# Patient Record
Sex: Female | Born: 1965 | Race: White | Hispanic: No | Marital: Married | Smoking: Never smoker
Health system: Southern US, Community
[De-identification: ages and names within clinical notes are randomized; demographics above are authoritative.]

## PROBLEM LIST (undated history)

## (undated) DIAGNOSIS — F32A Depression, unspecified: Secondary | ICD-10-CM

## (undated) DIAGNOSIS — F329 Major depressive disorder, single episode, unspecified: Secondary | ICD-10-CM

## (undated) DIAGNOSIS — E785 Hyperlipidemia, unspecified: Secondary | ICD-10-CM

## (undated) DIAGNOSIS — E049 Nontoxic goiter, unspecified: Secondary | ICD-10-CM

## (undated) HISTORY — DX: Depression, unspecified: F32.A

## (undated) HISTORY — PX: DILATION AND CURETTAGE OF UTERUS: SHX78

## (undated) HISTORY — DX: Nontoxic goiter, unspecified: E04.9

## (undated) HISTORY — DX: Hyperlipidemia, unspecified: E78.5

## (undated) HISTORY — DX: Major depressive disorder, single episode, unspecified: F32.9

---

## 1998-08-20 ENCOUNTER — Ambulatory Visit (HOSPITAL_COMMUNITY): Admission: RE | Admit: 1998-08-20 | Discharge: 1998-08-20 | Payer: Self-pay | Admitting: Obstetrics and Gynecology

## 1999-03-30 ENCOUNTER — Other Ambulatory Visit: Admission: RE | Admit: 1999-03-30 | Discharge: 1999-03-30 | Payer: Self-pay | Admitting: Obstetrics and Gynecology

## 1999-11-08 ENCOUNTER — Ambulatory Visit (HOSPITAL_COMMUNITY): Admission: RE | Admit: 1999-11-08 | Discharge: 1999-11-08 | Payer: Self-pay | Admitting: Obstetrics and Gynecology

## 1999-11-08 ENCOUNTER — Encounter: Payer: Self-pay | Admitting: Obstetrics and Gynecology

## 1999-12-06 ENCOUNTER — Inpatient Hospital Stay (HOSPITAL_COMMUNITY): Admission: AD | Admit: 1999-12-06 | Discharge: 1999-12-06 | Payer: Self-pay | Admitting: *Deleted

## 2000-01-16 ENCOUNTER — Inpatient Hospital Stay (HOSPITAL_COMMUNITY): Admission: AD | Admit: 2000-01-16 | Discharge: 2000-01-18 | Payer: Self-pay | Admitting: *Deleted

## 2000-01-17 ENCOUNTER — Encounter: Payer: Self-pay | Admitting: *Deleted

## 2000-01-18 ENCOUNTER — Encounter: Payer: Self-pay | Admitting: Obstetrics & Gynecology

## 2000-01-19 ENCOUNTER — Inpatient Hospital Stay (HOSPITAL_COMMUNITY): Admission: AD | Admit: 2000-01-19 | Discharge: 2000-01-22 | Payer: Self-pay | Admitting: Obstetrics and Gynecology

## 2000-02-07 ENCOUNTER — Encounter: Admission: RE | Admit: 2000-02-07 | Discharge: 2000-05-07 | Payer: Self-pay | Admitting: Obstetrics and Gynecology

## 2000-03-02 ENCOUNTER — Other Ambulatory Visit: Admission: RE | Admit: 2000-03-02 | Discharge: 2000-03-02 | Payer: Self-pay | Admitting: Obstetrics and Gynecology

## 2000-07-06 ENCOUNTER — Encounter: Payer: Self-pay | Admitting: Obstetrics and Gynecology

## 2000-07-06 ENCOUNTER — Encounter: Admission: RE | Admit: 2000-07-06 | Discharge: 2000-07-06 | Payer: Self-pay | Admitting: Obstetrics and Gynecology

## 2003-02-02 ENCOUNTER — Encounter (HOSPITAL_COMMUNITY): Admission: RE | Admit: 2003-02-02 | Discharge: 2003-05-03 | Payer: Self-pay | Admitting: Endocrinology

## 2005-03-02 ENCOUNTER — Other Ambulatory Visit: Admission: RE | Admit: 2005-03-02 | Discharge: 2005-03-02 | Payer: Self-pay | Admitting: Obstetrics and Gynecology

## 2005-04-13 ENCOUNTER — Encounter: Payer: Self-pay | Admitting: Family Medicine

## 2005-04-13 LAB — CONVERTED CEMR LAB

## 2005-05-05 ENCOUNTER — Ambulatory Visit: Payer: Self-pay | Admitting: Family Medicine

## 2006-05-14 ENCOUNTER — Ambulatory Visit: Payer: Self-pay | Admitting: Family Medicine

## 2006-05-14 LAB — CONVERTED CEMR LAB
Albumin: 3.6 g/dL (ref 3.5–5.2)
Alkaline Phosphatase: 48 units/L (ref 39–117)
BUN: 9 mg/dL (ref 6–23)
Basophils Absolute: 0 10*3/uL (ref 0.0–0.1)
CO2: 31 meq/L (ref 19–32)
Cholesterol: 231 mg/dL (ref 0–200)
Eosinophils Absolute: 0.2 10*3/uL (ref 0.0–0.6)
GFR calc Af Amer: 142 mL/min
GFR calc non Af Amer: 118 mL/min
HDL: 68.2 mg/dL (ref >39.0)
Hemoglobin: 12.5 g/dL (ref 12.0–15.0)
Lymphocytes Relative: 30.5 % (ref 12.0–46.0)
MCHC: 34.4 g/dL (ref 30.0–36.0)
MCV: 91.7 fL (ref 78.0–100.0)
Monocytes Absolute: 0.4 10*3/uL (ref 0.2–0.7)
Monocytes Relative: 8.6 % (ref 3.0–11.0)
Neutro Abs: 2.8 10*3/uL (ref 1.4–7.7)
Platelets: 249 10*3/uL (ref 150–400)
Potassium: 3.9 meq/L (ref 3.5–5.1)
TSH: 1.58 microintl units/mL (ref 0.35–5.50)
Total Protein: 6.2 g/dL (ref 6.0–8.3)

## 2007-06-06 ENCOUNTER — Encounter: Payer: Self-pay | Admitting: Family Medicine

## 2007-06-06 DIAGNOSIS — F329 Major depressive disorder, single episode, unspecified: Secondary | ICD-10-CM

## 2007-06-06 DIAGNOSIS — E049 Nontoxic goiter, unspecified: Secondary | ICD-10-CM | POA: Insufficient documentation

## 2007-06-07 ENCOUNTER — Ambulatory Visit: Payer: Self-pay | Admitting: Family Medicine

## 2007-06-07 DIAGNOSIS — E785 Hyperlipidemia, unspecified: Secondary | ICD-10-CM

## 2007-06-14 ENCOUNTER — Ambulatory Visit: Payer: Self-pay | Admitting: Family Medicine

## 2007-06-18 LAB — CONVERTED CEMR LAB
Basophils Absolute: 0 10*3/uL (ref 0.0–0.1)
Basophils Relative: 0.1 % (ref 0.0–1.0)
Calcium: 9.6 mg/dL (ref 8.4–10.5)
Cholesterol: 226 mg/dL (ref 0–200)
Creatinine, Ser: 0.8 mg/dL (ref 0.4–1.2)
Direct LDL: 140.2 mg/dL
Eosinophils Absolute: 0.1 10*3/uL (ref 0.0–0.7)
Free T4: 0.9 ng/dL (ref 0.6–1.6)
GFR calc non Af Amer: 84 mL/min
HDL: 79.6 mg/dL (ref >39.0)
MCHC: 32.5 g/dL (ref 30.0–36.0)
MCV: 91.9 fL (ref 78.0–100.0)
Neutro Abs: 2.7 10*3/uL (ref 1.4–7.7)
Neutrophils Relative %: 58.5 % (ref 43.0–77.0)
RBC: 4.4 M/uL (ref 3.87–5.11)
Total Bilirubin: 0.7 mg/dL (ref 0.3–1.2)

## 2007-07-06 ENCOUNTER — Encounter: Payer: Self-pay | Admitting: Family Medicine

## 2007-07-15 ENCOUNTER — Encounter (INDEPENDENT_AMBULATORY_CARE_PROVIDER_SITE_OTHER): Payer: Self-pay | Admitting: *Deleted

## 2008-04-04 ENCOUNTER — Emergency Department (HOSPITAL_COMMUNITY): Admission: EM | Admit: 2008-04-04 | Discharge: 2008-04-04 | Payer: Self-pay | Admitting: Emergency Medicine

## 2008-04-13 ENCOUNTER — Ambulatory Visit: Payer: Self-pay | Admitting: Family Medicine

## 2008-04-13 DIAGNOSIS — R51 Headache: Secondary | ICD-10-CM

## 2008-04-13 DIAGNOSIS — R519 Headache, unspecified: Secondary | ICD-10-CM | POA: Insufficient documentation

## 2008-04-13 LAB — CONVERTED CEMR LAB
Glucose, Urine, Semiquant: NEGATIVE
RBC / HPF: 0
Specific Gravity, Urine: 1.015
Urine crystals, microscopic: 0 /hpf
Urobilinogen, UA: 0.2

## 2008-04-15 LAB — CONVERTED CEMR LAB
ALT: 12 units/L (ref 0–35)
Albumin: 3.9 g/dL (ref 3.5–5.2)
Alkaline Phosphatase: 36 units/L — ABNORMAL LOW (ref 39–117)
Basophils Relative: 0.3 % (ref 0.0–3.0)
Chloride: 104 meq/L (ref 96–112)
Creatinine, Ser: 0.6 mg/dL (ref 0.4–1.2)
Eosinophils Absolute: 0 10*3/uL (ref 0.0–0.7)
Eosinophils Relative: 0.7 % (ref 0.0–5.0)
HCT: 37.1 % (ref 36.0–46.0)
MCV: 91.8 fL (ref 78.0–100.0)
Monocytes Relative: 9 % (ref 3.0–12.0)
Neutrophils Relative %: 52.3 % (ref 43.0–77.0)
RBC: 4.04 M/uL (ref 3.87–5.11)
Total Protein: 6.6 g/dL (ref 6.0–8.3)
WBC: 6.3 10*3/uL (ref 4.5–10.5)

## 2008-04-16 ENCOUNTER — Encounter: Admission: RE | Admit: 2008-04-16 | Discharge: 2008-04-16 | Payer: Self-pay | Admitting: Family Medicine

## 2008-04-20 DIAGNOSIS — N83209 Unspecified ovarian cyst, unspecified side: Secondary | ICD-10-CM

## 2008-04-23 ENCOUNTER — Encounter: Payer: Self-pay | Admitting: Family Medicine

## 2008-05-13 ENCOUNTER — Other Ambulatory Visit: Admission: RE | Admit: 2008-05-13 | Discharge: 2008-05-13 | Payer: Self-pay | Admitting: Obstetrics and Gynecology

## 2008-05-21 ENCOUNTER — Encounter: Payer: Self-pay | Admitting: Family Medicine

## 2008-07-21 ENCOUNTER — Ambulatory Visit: Payer: Self-pay | Admitting: Family Medicine

## 2008-07-22 LAB — CONVERTED CEMR LAB
Cholesterol: 257 mg/dL — ABNORMAL HIGH (ref 0–200)
HDL: 79 mg/dL (ref >39.00)
Total CHOL/HDL Ratio: 3
Triglycerides: 77 mg/dL (ref 0.0–149.0)
VLDL: 15.4 mg/dL (ref 0.0–40.0)

## 2008-08-06 ENCOUNTER — Telehealth (INDEPENDENT_AMBULATORY_CARE_PROVIDER_SITE_OTHER): Payer: Self-pay | Admitting: *Deleted

## 2008-09-24 ENCOUNTER — Ambulatory Visit: Payer: Self-pay | Admitting: Family Medicine

## 2008-09-29 LAB — CONVERTED CEMR LAB
ALT: 12 units/L (ref 0–35)
AST: 18 units/L (ref 0–37)
Direct LDL: 139 mg/dL

## 2009-12-11 LAB — CONVERTED CEMR LAB: Pap Smear: NORMAL

## 2010-01-26 ENCOUNTER — Telehealth (INDEPENDENT_AMBULATORY_CARE_PROVIDER_SITE_OTHER): Payer: Self-pay | Admitting: *Deleted

## 2010-01-28 ENCOUNTER — Telehealth: Payer: Self-pay | Admitting: Family Medicine

## 2010-01-31 ENCOUNTER — Encounter: Payer: Self-pay | Admitting: Family Medicine

## 2010-02-02 ENCOUNTER — Ambulatory Visit: Payer: Self-pay | Admitting: Family Medicine

## 2010-02-06 LAB — CONVERTED CEMR LAB
ALT: 8 units/L (ref 0–35)
BUN: 9 mg/dL (ref 6–23)
Cholesterol: 224 mg/dL — ABNORMAL HIGH (ref 0–200)
Eosinophils Absolute: 0.1 10*3/uL (ref 0.0–0.7)
Eosinophils Relative: 1.1 % (ref 0.0–5.0)
HDL: 78 mg/dL (ref >39)
Indirect Bilirubin: 0.5 mg/dL (ref 0.0–0.9)
MCV: 93.8 fL (ref 78.0–100.0)
Monocytes Absolute: 0.4 10*3/uL (ref 0.1–1.0)
Neutrophils Relative %: 55.6 % (ref 43.0–77.0)
Platelets: 244 10*3/uL (ref 150.0–400.0)
Potassium: 4.2 meq/L (ref 3.5–5.3)
Sodium: 141 meq/L (ref 135–145)
Total CHOL/HDL Ratio: 2.9
Total Protein: 6.7 g/dL (ref 6.0–8.3)
Triglycerides: 62 mg/dL (ref <150)
VLDL: 12 mg/dL (ref 0–40)
WBC: 5.2 10*3/uL (ref 4.5–10.5)

## 2010-02-08 ENCOUNTER — Encounter: Payer: Self-pay | Admitting: Family Medicine

## 2010-02-11 ENCOUNTER — Ambulatory Visit: Payer: Self-pay | Admitting: Family Medicine

## 2010-03-01 ENCOUNTER — Encounter: Payer: Self-pay | Admitting: Family Medicine

## 2010-04-03 ENCOUNTER — Encounter: Payer: Self-pay | Admitting: Family Medicine

## 2010-04-12 NOTE — Progress Notes (Signed)
----   Converted from flag ---- ---- 01/25/2010 11:15 PM, Colon Flattery Tower MD wrote: please check wellness and lipid v70.0 and 272 thanks   ---- 01/25/2010 10:54 AM, Liane Comber CMA (AAMA) wrote: Lab orders please! Good Morning! This pt is scheduled for cpx labs Wed, which labs to draw and dx codes to use? Thanks Tasha ------------------------------

## 2010-04-12 NOTE — Progress Notes (Signed)
Summary: please advise.   Phone Note Call from Patient Call back at Work Phone [phone]   Caller: Patient Call For: Judith Part MD Summary of Call: Patient is coming in for labs on 11-23 and she is asking if you would be willing to add a TSH.  Initial call taken by: Melody Comas,  January 28, 2010 4:23 PM  Follow-up for Phone Call        that is fine add tsh for goiter and 272 - thanks  Follow-up by: Judith Part MD,  January 28, 2010 4:41 PM  Additional Follow-up for Phone Call Additional follow up Details #1::        TSH added to 02/02/10 labs. Patient notified as instructed by telephone. Lewanda Rife LPN  January 28, 2010 5:12 PM

## 2010-04-12 NOTE — Assessment & Plan Note (Signed)
Summary: CPX/CLE  R/S FROM 11/29   Vital Signs:  Patient profile:   45 year old female LMP:     01/27/2010 Height:      65 inches Weight:      152.25 pounds BMI:     25.43 Temp:     98.1 degrees F oral Pulse rate:   70 / minute Pulse rhythm:   regular BP sitting:   110 / 60  (left arm) Cuff size:   regular  Vitals Entered By: Selena Batten Dance CMA Duncan Dull) (February 11, 2010 12:21 PM) CC: CPx LMP (date): 01/27/2010     Enter LMP: 01/27/2010 Last PAP Result normal   History of Present Illness: here for health mt exam and to re chronic med problems is doing well and gets some time off for the holidays  is working a lot   is feeling ok overall  nothing new medically   wt is up 9 lb  110/60- good bp   goiter- Dr Talmage Nap-- has appt on 7th to get it checked our tsh is nl  depression-- no meds and mood is stable   lipids stable with high HDL and LDL in 130s- no real change is eating a fairly healthy diet -- does not watch as well as she should  avoids french fries / fried food when she can  no exercise -- even though she needs it for stress    last pap -- just had with her gyn  was in october  no ovarian cysts  periods are ok    mam 11/11 nl  self exam - no lumps or changes   td 07 flu shot-- got that in october   Allergies: 1)  ! 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 Eyes:  Denies blurring and eye irritation. CV:  Denies chest pain or discomfort, palpitations, shortness of breath with exertion, and swelling of feet. Resp:  Denies cough, shortness of breath, and wheezing. GI:  Denies abdominal pain, change in bowel habits, indigestion, and nausea. GU:  Denies abnormal vaginal bleeding, discharge, dysuria, and urinary frequency. MS:  Denies joint pain, joint redness, and joint swelling. Derm:  Denies itching, lesion(s), poor wound healing, and rash. Neuro:  Denies numbness and tingling. Psych:  Denies anxiety and depression. Endo:  Denies cold intolerance, excessive thirst, excessive urination, and heat intolerance. Heme:  Denies abnormal bruising and bleeding.  Physical Exam  General:  Well-developed,well-nourished,in no acute distress; alert,appropriate and cooperative throughout examination Head:  normocephalic, atraumatic, and no abnormalities observed.   Eyes:  vision grossly intact, pupils equal, pupils round, and pupils reactive to light.  no conjunctival pallor, injection or icterus  Ears:  R ear normal and L ear normal.   Nose:  no nasal discharge.   Mouth:  pharynx pink and moist.  Neck:  supple with full rom and no masses or thyromegally, no JVD or carotid bruit  Chest Wall:  No deformities, masses, or tenderness noted. Lungs:  Normal respiratory effort, chest expands symmetrically. Lungs are clear to auscultation, no crackles or wheezes. Heart:  Normal rate and regular rhythm. S1 and S2 normal without gallop, murmur, click, rub or other extra sounds. Abdomen:  Bowel sounds positive,abdomen soft and non-tender without masses, organomegaly or hernias noted. no renal bruits Msk:  No deformity or scoliosis noted of thoracic or lumbar spine.  no acute joint changes, full rom of all joints  Pulses:  R  and L carotid,radial,femoral,dorsalis pedis and posterior tibial pulses are full and equal bilaterally Extremities:  No clubbing, cyanosis, edema, or deformity noted with normal full range of motion of all joints.   Neurologic:  sensation intact to light touch, gait normal, and DTRs symmetrical and normal.   Skin:  Intact without suspicious lesions or rashes Cervical Nodes:  No lymphadenopathy noted Inguinal Nodes:  No significant adenopathy Psych:  normal affect, talkative and pleasant    Impression & Recommendations:  Problem # 1:  HEALTH MAINTENANCE EXAM (ICD-V70.0) Assessment Comment Only reviewed health habits including diet, exercise and skin cancer prevention reviewed health maintenance list and family history up to date on gyn care  disc balance and fitting exercise into schedule  rev her labs in detail  mam nl and up to date   Problem # 2:  GOITER (ICD-240.9) Assessment: Comment Only stable - for endo f/u and check up soon  tsh theraputic and asymptomatic   Problem # 3:  HYPERLIPIDEMIA (ICD-272.4) Assessment: Unchanged  chol is stable  goal LDL under 130 - disc risks of high chol disc low sat fat diet in detail  Labs Reviewed: SGOT: 14 (02/02/2010)   SGPT: 8 (02/02/2010)   HDL:78 (02/02/2010), 76.10 (09/24/2008)  LDL:134 (02/02/2010), DEL (06/14/2007)  Chol:224 (02/02/2010), 228 (09/24/2008)  Trig:62 (02/02/2010), 56.0 (09/24/2008)  Complete Medication List: 1)  Multivitamins Tabs (Multiple vitamin) .... Take 1 tablet by mouth once a day  Patient Instructions: 1)  we will send Dr Talmage Nap a copy of labs  2)  keep working on balance and getting some exercise   Orders Added: 1)  Est. Patient 40-64 years [99396]   Immunization History:  Influenza Immunization History:    Influenza:  fluvax 3+ (12/13/2009)   Immunization History:  Influenza Immunization History:    Influenza:  Fluvax 3+ (12/13/2009)  Current Allergies (reviewed today): ! *  NARCOTICS   Preventive Care Screening  Pap Smear:    Date:  12/11/2009    Results:  normal

## 2010-04-12 NOTE — Letter (Signed)
Summary: Results Follow up Letter  [facility]  [address] [place], [place] [address]   Phone: [phone]  Fax: [phone]    02/08/2010 MRN: [id number]    VELETA YAMAMOTO [address] [place], [place]  [address]    Dear Ms. Zeitler,  The following are the results of your recent test(s):  Test         Result    Pap Smear:        Normal _____  Not Normal _____ Comments: ______________________________________________________ Cholesterol: LDL(Bad cholesterol):         Your goal is less than:         HDL (Good cholesterol):       Your goal is more than: Comments:  ______________________________________________________ Mammogram:        Normal __X___  Not Normal _____ Comments:Repeat in one year.   ___________________________________________________________________ Hemoccult:        Normal _____  Not normal _______ Comments:    _____________________________________________________________________ Other Tests:    We routinely do not discuss normal results over the telephone.  If you desire a copy of the results, or you have any questions about this information we can discuss them at your next office visit.   Sincerely,    Idamae Schuller Raeven Pint,MD  MT/ri

## 2010-04-14 NOTE — Letter (Signed)
Summary: Proof of Physical Form/[facility]  Proof of Physical Form/[facility]   Imported By: Lanelle Bal 03/10/2010 08:10:22  _____________________________________________________________________  External Attachment:    Type:   Image     Comment:   External Document

## 2010-06-27 LAB — POCT URINALYSIS DIP (DEVICE)
Ketones, ur: NEGATIVE mg/dL
Protein, ur: 30 mg/dL — AB
Urobilinogen, UA: 0.2 mg/dL (ref 0.0–1.0)

## 2010-06-27 LAB — POCT PREGNANCY, URINE: Preg Test, Ur: NEGATIVE

## 2010-06-27 LAB — URINE CULTURE

## 2010-07-29 NOTE — H&P (Signed)
[facility]  Patient:    Meredith Farmer, Meredith Farmer                 MRN: [id number] Attending:  Duke Salvia. Marcelle Overlie, M.D.                         History and Physical  CHIEF COMPLAINT:              Rupture of membranes at 36 weeks.  HISTORY OF PRESENT ILLNESS:   A 45 year old G2 P0-0-1-0, EDD is February 15, 2000, she is 36 weeks today.  Presents to the office with gross rupture of membranes and no labor.  Fluid was aspirated and will be sent for Amniostat and FLM.  She is afebrile.  Her pregnancy has been otherwise uncomplicated, with a one-hour GTT of 105.  Group B strep screen on December 07, 1999 was negative.  Blood type was O negative, rubella titer was nonimmune.  PAST MEDICAL HISTORY:  ALLERGIES:                    None.  OPERATIONS:                   A D&E for SAB in June 2000.  REVIEW OF SYSTEMS:            Significant for history of depression.  PHYSICAL EXAMINATION:  VITAL SIGNS:                  Temperature 98.9, blood pressure 120/70.  HEENT:                        Unremarkable.  NECK:                         Supple without masses.  LUNGS:                        Clear.  CARDIOVASCULAR:               Regular rate and rhythm without murmurs, rubs, or gallops noted.  BREASTS:                      Not examined.  ABDOMEN:                      Fundal height 35 cm.  Fetal heart rate 140.  PELVIC:                       Cervix was closed.  Clear fluid was noted.  EXTREMITIES AND NEUROLOGIC:   Unremarkable.  IMPRESSION:                   IUP at 36 weeks, SROM.  PLAN:                         Will send fluid for Amniostat and FLM, and wait to see if she starts labor spontaneously. DD:  01/19/00 TD:  01/19/00 Job: [id number]

## 2010-09-26 IMAGING — US US TRANSVAGINAL NON-OB
1 series · 14 of 25 positions shown · non-contrast
Comparison: None

CLINICAL DATA: Pelvic pain

TRANSABDOMINAL AND TRANSVAGINAL ULTRASOUND OF PELVIS
TECHNIQUE: Both transabdominal and transvaginal ultrasound
examinations of the pelvis were performed including evaluation of
the uterus, ovaries, adnexal regions, and pelvic cul-de-sac.

[Series 1: us transvaginal non-ob · 0.30mm/px · 14 of 68 slices shown]
[im 1/68]
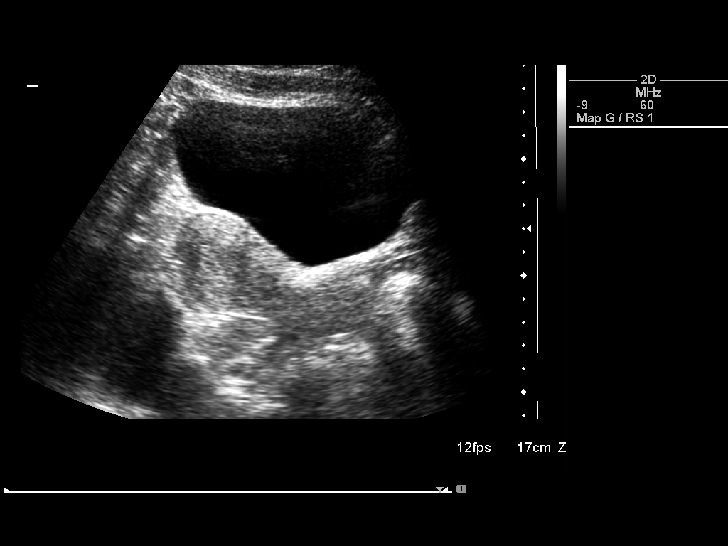
[im 6/68]
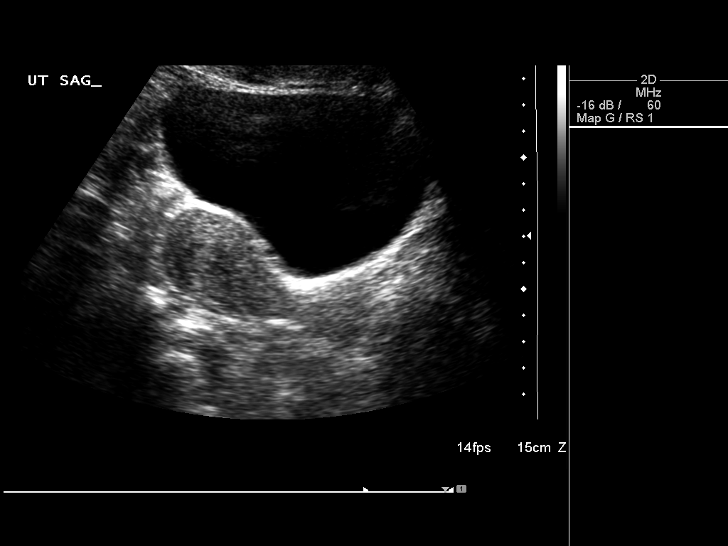
[im 12/68]
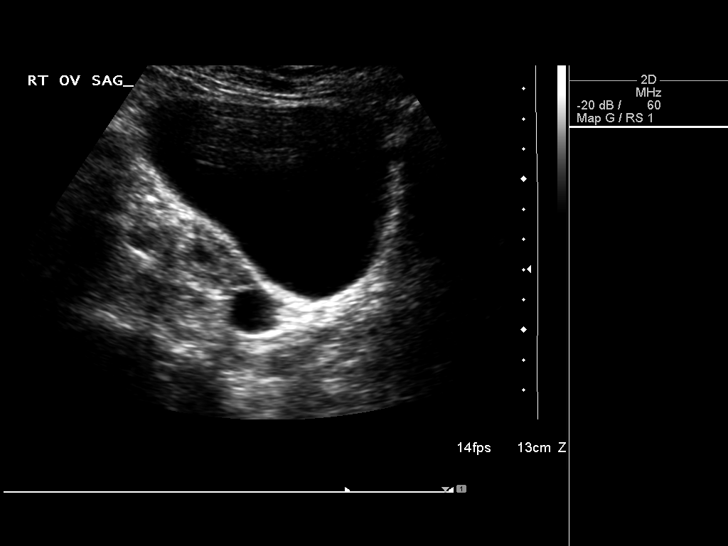
[im 17/68]
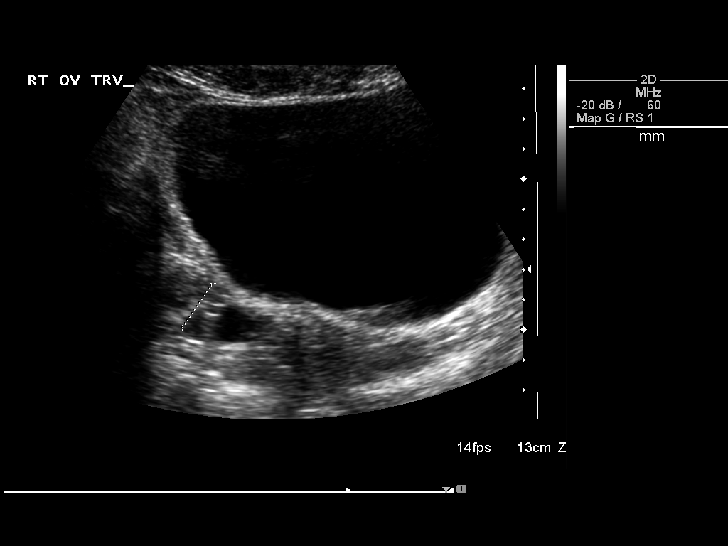
[im 23/68]
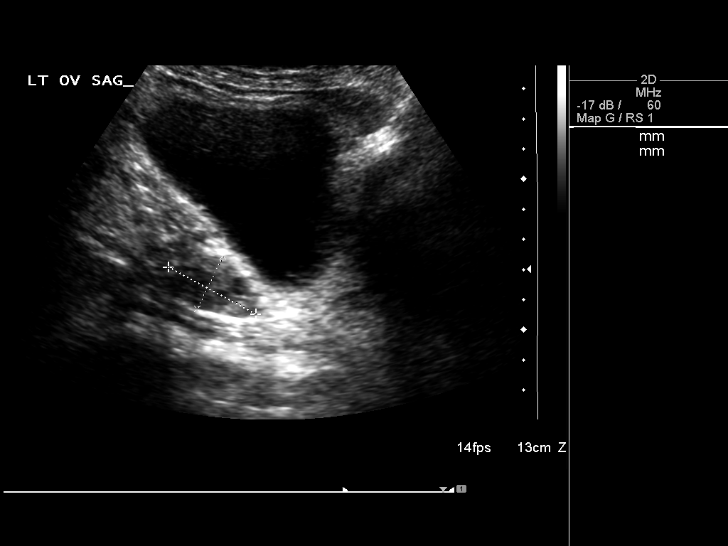
[im 26/68]
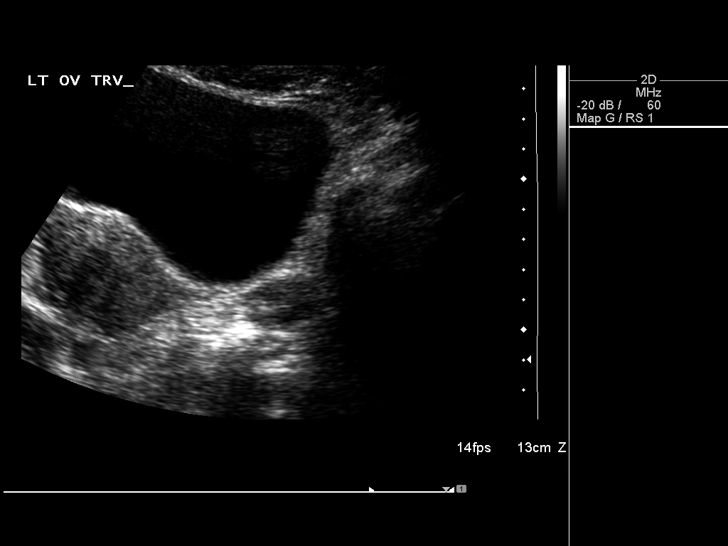
[im 31/68]
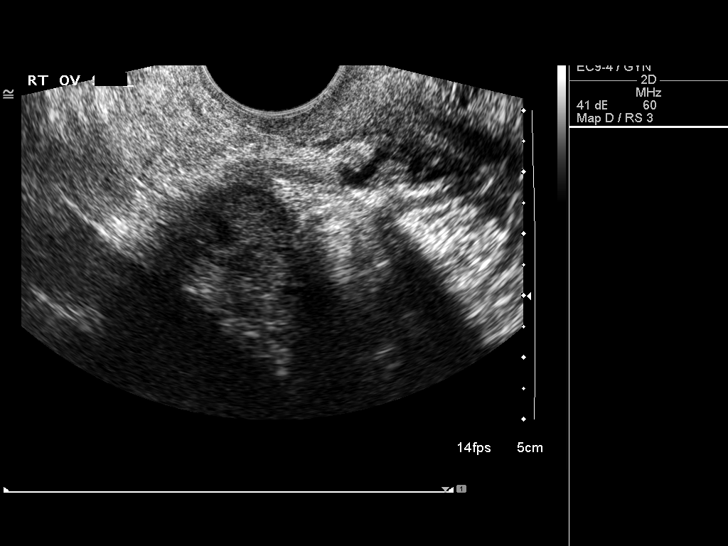
[im 37/68]
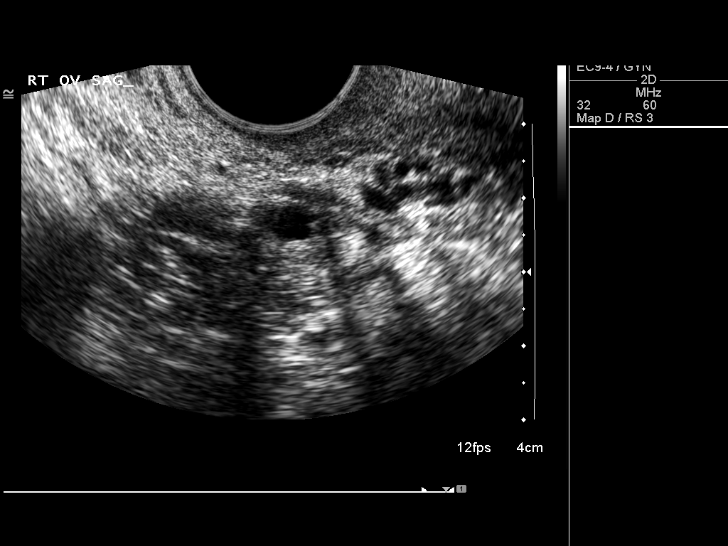
[im 42/68]
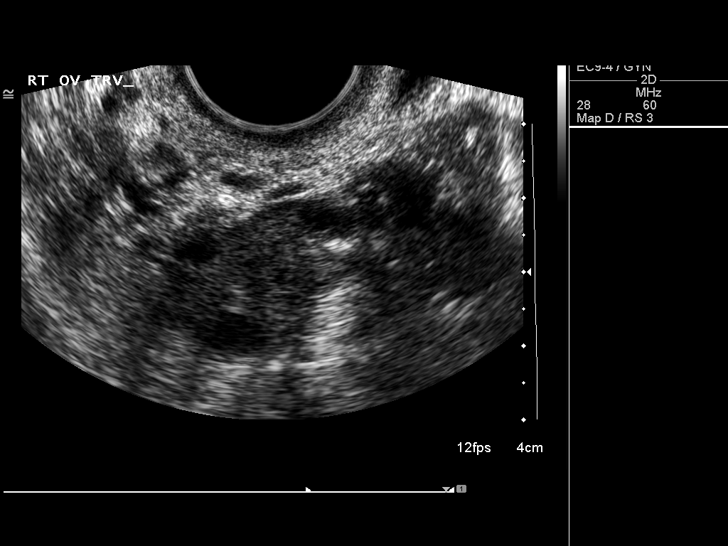
[im 45/68]
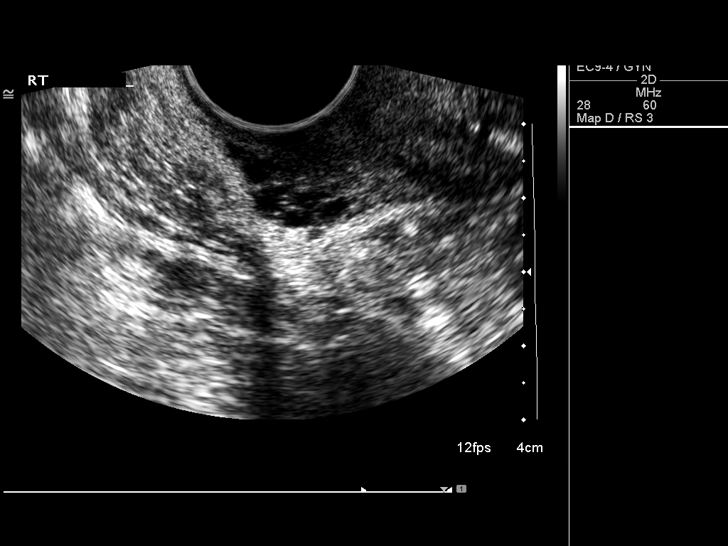
[im 51/68]
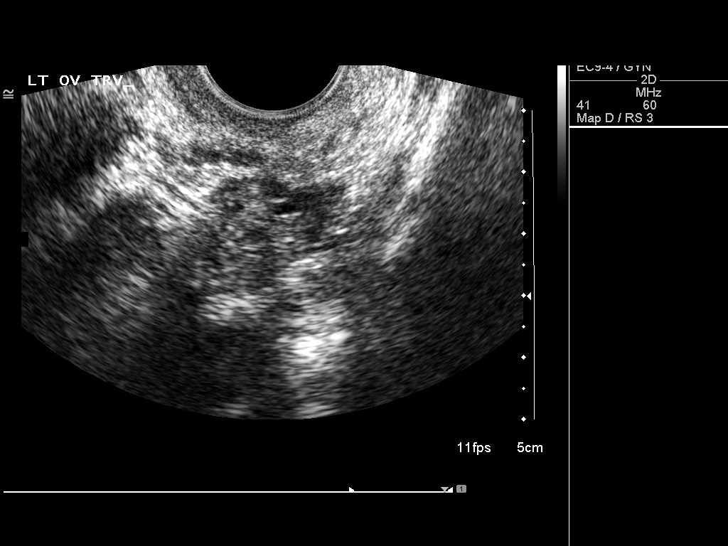
[im 56/68]
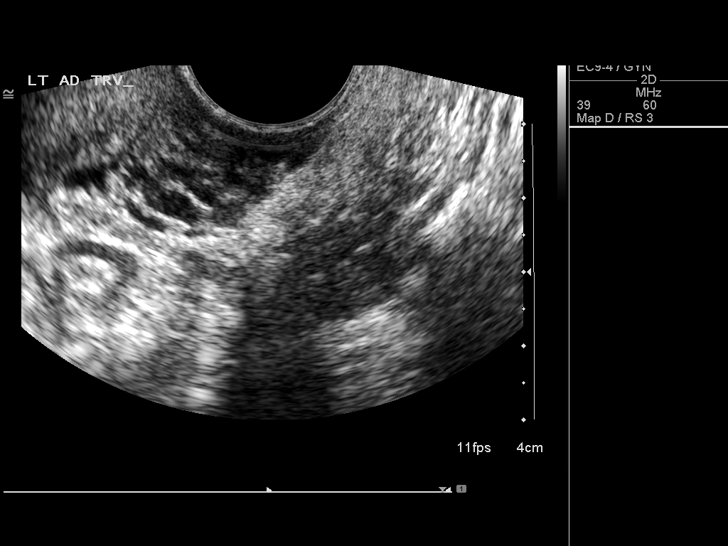
[im 62/68]
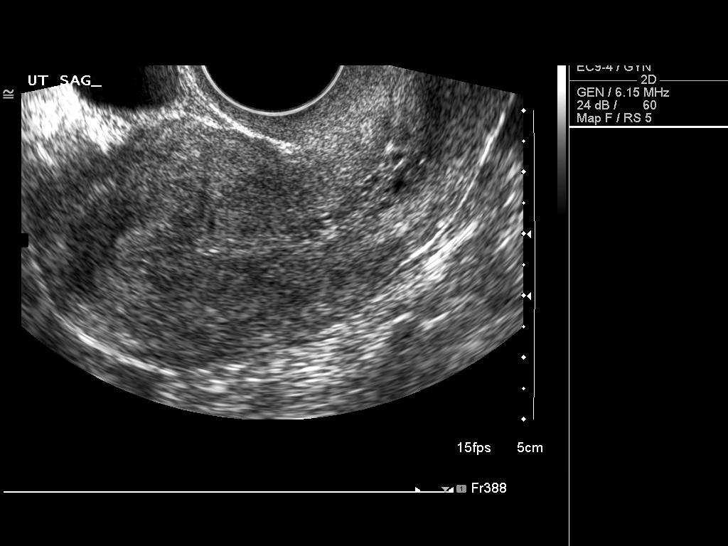
[im 68/68]
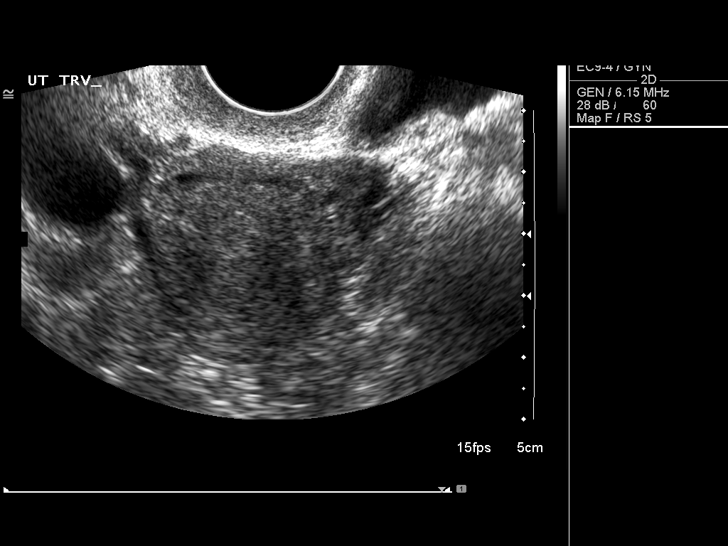

[14 of 25 positions shown; findings below may reference images not displayed]

FINDINGS: The uterus measures 7.4 cm sagittally, with a depth of
4.1 cm and width of 4.2 cm.  The endometrium measures 8.0 mm in
thickness.  The ovaries are normal in size and there is a small,
simple appearing right ovarian cyst of 2.0 x 1.4 x 1.7 cm.  No free
fluid is seen.
IMPRESSION: 1.  The uterus and endometrium appear normal.
2.  2 cm simple appearing right ovarian cyst.  No free fluid.

REF:G3 DICTATED: 04/16/2008 [DATE]

## 2011-01-11 ENCOUNTER — Encounter: Payer: Self-pay | Admitting: Family Medicine

## 2011-01-11 ENCOUNTER — Telehealth: Payer: Self-pay | Admitting: *Deleted

## 2011-01-11 NOTE — Telephone Encounter (Signed)
If it was a small amount of blood, now resolved without ongoing bleeding, no CP/sob, and not having sx of syncope/presyncope, then okay to wait until tomorrow.  No new meds in meantime.

## 2011-01-11 NOTE — Telephone Encounter (Signed)
Patient notified that it was ok to wait until her appt tomorrow. She verbalized understanding.

## 2011-01-11 NOTE — Telephone Encounter (Signed)
For 2 weeks pt has had lower abdominal pain crampy pain with soreness and at least 1 loose BM (diarrhea) daily. Last week lower left back pain started also.No urinary symptoms.Today pt had a more formed BM but noticed small amount of bright red blood also. Pt has no fever, no nausea or vomiting. Pt said x 1 dizziness that only lasted a few minutes one week ago. Pt has been eating a lot of spicy foods. Suggested not eating spicy foods and eating more bland diet. Pt last seen by Dr Milinda Antis on 12/*02/11 and pt already made appt with Dr Ermalene Searing on 01/12/11. Pt concerned about seeing blood even thou it was small amt. Pt wants a doctor to be aware of her symptoms prior to visit on 01/12/11.Pt uses [facility] and can be reached at [phone].

## 2011-01-11 NOTE — Telephone Encounter (Signed)
Pt c/o abd pain, low back pain, and saw a little blood in stool today.  This message was left on voicemail a few minutes ago. I called pt to get more info but had to leave a message for her on her voicemail.

## 2011-01-11 NOTE — Telephone Encounter (Signed)
I spoke with Dr Milinda Antis, she advised appt either here or if nothing avail with another Dr then Urgent Care. I called pt to advise and had to leave a message on voicemail with instructions to go to urgent care asap and to call back if any questions.

## 2011-01-12 ENCOUNTER — Ambulatory Visit (INDEPENDENT_AMBULATORY_CARE_PROVIDER_SITE_OTHER): Payer: 59 | Admitting: Family Medicine

## 2011-01-12 ENCOUNTER — Encounter: Payer: Self-pay | Admitting: Family Medicine

## 2011-01-12 DIAGNOSIS — R1030 Lower abdominal pain, unspecified: Secondary | ICD-10-CM | POA: Insufficient documentation

## 2011-01-12 DIAGNOSIS — K921 Melena: Secondary | ICD-10-CM | POA: Insufficient documentation

## 2011-01-12 DIAGNOSIS — R109 Unspecified abdominal pain: Secondary | ICD-10-CM

## 2011-01-12 LAB — CBC WITH DIFFERENTIAL/PLATELET
Basophils Relative: 0.5 % (ref 0.0–3.0)
Eosinophils Absolute: 0 10*3/uL (ref 0.0–0.7)
HCT: 40.3 % (ref 36.0–46.0)
Hemoglobin: 13.8 g/dL (ref 12.0–15.0)
MCHC: 34.2 g/dL (ref 30.0–36.0)
MCV: 93.8 fl (ref 78.0–100.0)
Monocytes Absolute: 0.3 10*3/uL (ref 0.1–1.0)
Neutro Abs: 3.6 10*3/uL (ref 1.4–7.7)
RBC: 4.29 Mil/uL (ref 3.87–5.11)

## 2011-01-12 NOTE — Assessment & Plan Note (Addendum)
Along with diarrhea... Most consistent with infection or colitis. Not clearly typical for diverticulitis bleeding. Will send for culture.  Check cbc.  Pt to push fluids

## 2011-01-12 NOTE — Assessment & Plan Note (Signed)
No clear hemmorhoids, fissure, etc.

## 2011-01-12 NOTE — Patient Instructions (Addendum)
Push fluids, regular healthy low fat diet. We will call with lab and culture results.  Call if fever, or change in symtpoms such as severe abdominal pain.

## 2011-01-12 NOTE — Progress Notes (Signed)
  Subjective:    Patient ID: Meredith Farmer, female    DOB: 20-Dec-1965, 45 y.o.   MRN: [id number]  HPI  45 year old female pt of Dr. Lucretia Roers presents with abdominal pain, intermittant over last 2-3 weeks.  Has had looser stools in past few weeks.  Yesterday saw bright red blood in stool, on outside of stool, no drops. Abdominal cramping with BM.  Soreness in left lower abdomen constantly now in last few days. Mild low back pain in last few weeks as well.  No rectal pain. No N/V, no fever.  No recent trip out of country. No recent antibiotics.  Has never had a colonoscopy in past. No first degree relative with cancer or Gi chornic issues.   Has also noted menses twice this month on 10/7 and 10/24.     Review of Systems  Constitutional: Negative for fever and fatigue.  HENT: Negative for ear pain.   Eyes: Negative for pain.  Respiratory: Negative for cough and shortness of breath.   Cardiovascular: Negative for chest pain, palpitations and leg swelling.  Genitourinary: Negative for dysuria and hematuria.       Objective:   Physical Exam  Constitutional: Vital signs are normal. She appears well-developed and well-nourished. She is cooperative.  Non-toxic appearance. She does not appear ill. No distress.  HENT:  Head: Normocephalic.  Right Ear: Hearing, tympanic membrane and ear canal normal. Tympanic membrane is not erythematous, not retracted and not bulging.  Left Ear: Hearing, tympanic membrane, external ear and ear canal normal. Tympanic membrane is not erythematous, not retracted and not bulging.  Nose: No mucosal edema or rhinorrhea. Right sinus exhibits no maxillary sinus tenderness and no frontal sinus tenderness. Left sinus exhibits no maxillary sinus tenderness and no frontal sinus tenderness.  Mouth/Throat: Uvula is midline and mucous membranes are normal.  Eyes: Conjunctivae and lids are normal. No foreign bodies found.  Neck: Trachea normal and normal range  of motion. Neck supple. Carotid bruit is not present. No mass and no thyromegaly present.  Cardiovascular: Normal rate, regular rhythm, S1 normal, S2 normal, normal heart sounds, intact distal pulses and normal pulses.  Exam reveals no gallop and no friction rub.   No murmur heard. Pulmonary/Chest: Effort normal and breath sounds normal. Not tachypneic. No respiratory distress. She has no decreased breath sounds. She has no wheezes. She has no rhonchi. She has no rales.  Abdominal: Soft. Normal appearance and bowel sounds are normal. There is tenderness in the left lower quadrant.  Genitourinary: Rectum normal. Rectal exam shows no external hemorrhoid, no internal hemorrhoid, no fissure, no mass, no tenderness and anal tone normal. Guaiac negative stool.  Neurological: She is alert.  Skin: Skin is warm, dry and intact. No rash noted.  Psychiatric: Her speech is normal and behavior is normal. Judgment and thought content normal. Her mood appears not anxious. Cognition and memory are normal. She does not exhibit a depressed mood.          Assessment & Plan:

## 2011-01-13 ENCOUNTER — Other Ambulatory Visit: Payer: Self-pay | Admitting: Family Medicine

## 2011-01-17 LAB — STOOL CULTURE

## 2011-02-23 ENCOUNTER — Telehealth: Payer: Self-pay | Admitting: Family Medicine

## 2011-02-23 DIAGNOSIS — Z0000 Encounter for general adult medical examination without abnormal findings: Secondary | ICD-10-CM | POA: Insufficient documentation

## 2011-02-23 DIAGNOSIS — E785 Hyperlipidemia, unspecified: Secondary | ICD-10-CM

## 2011-02-23 NOTE — Telephone Encounter (Signed)
Message copied by Judy Pimple on Thu Feb 23, 2011  7:41 PM ------      Message from: Meredith Farmer      Created: Thu Feb 16, 2011  9:10 AM      Regarding: Cpx labs Fri 12/14       Please order  future cpx labs for pt's upcomming lab appt.      Thanks      Rodney Booze

## 2011-02-24 ENCOUNTER — Other Ambulatory Visit (INDEPENDENT_AMBULATORY_CARE_PROVIDER_SITE_OTHER): Payer: 59

## 2011-02-24 DIAGNOSIS — E785 Hyperlipidemia, unspecified: Secondary | ICD-10-CM

## 2011-02-24 DIAGNOSIS — Z0000 Encounter for general adult medical examination without abnormal findings: Secondary | ICD-10-CM

## 2011-02-24 LAB — COMPREHENSIVE METABOLIC PANEL
AST: 19 U/L (ref 0–37)
BUN: 11 mg/dL (ref 6–23)
Calcium: 9.6 mg/dL (ref 8.4–10.5)
Chloride: 106 mEq/L (ref 96–112)
Creatinine, Ser: 0.7 mg/dL (ref 0.4–1.2)
GFR: 94.32 mL/min (ref >60.00)
Glucose, Bld: 85 mg/dL (ref 70–99)

## 2011-02-24 LAB — LIPID PANEL
Cholesterol: 274 mg/dL — ABNORMAL HIGH (ref 0–200)
HDL: 104.8 mg/dL
Total CHOL/HDL Ratio: 3
Triglycerides: 57 mg/dL (ref 0.0–149.0)
VLDL: 11.4 mg/dL (ref 0.0–40.0)

## 2011-02-24 LAB — LDL CHOLESTEROL, DIRECT: Direct LDL: 148.5 mg/dL

## 2011-02-24 LAB — CBC WITH DIFFERENTIAL/PLATELET
Basophils Relative: 0.4 % (ref 0.0–3.0)
Eosinophils Absolute: 0 10*3/uL (ref 0.0–0.7)
Eosinophils Relative: 0.5 % (ref 0.0–5.0)
Hemoglobin: 14.6 g/dL (ref 12.0–15.0)
Lymphocytes Relative: 27.6 % (ref 12.0–46.0)
MCHC: 34.3 g/dL (ref 30.0–36.0)
MCV: 93.7 fl (ref 78.0–100.0)
Neutro Abs: 3.5 10*3/uL (ref 1.4–7.7)
RBC: 4.55 Mil/uL (ref 3.87–5.11)

## 2011-02-24 LAB — TSH: TSH: 0.81 u[IU]/mL (ref 0.35–5.50)

## 2011-03-03 ENCOUNTER — Encounter: Payer: Self-pay | Admitting: Family Medicine

## 2011-03-03 ENCOUNTER — Ambulatory Visit (INDEPENDENT_AMBULATORY_CARE_PROVIDER_SITE_OTHER): Payer: 59 | Admitting: Family Medicine

## 2011-03-03 VITALS — BP 118/64 | HR 76 | Temp 98.3°F | Ht 65.0 in | Wt 155.5 lb

## 2011-03-03 DIAGNOSIS — Z0000 Encounter for general adult medical examination without abnormal findings: Secondary | ICD-10-CM

## 2011-03-03 DIAGNOSIS — E785 Hyperlipidemia, unspecified: Secondary | ICD-10-CM

## 2011-03-03 DIAGNOSIS — Z01419 Encounter for gynecological examination (general) (routine) without abnormal findings: Secondary | ICD-10-CM | POA: Insufficient documentation

## 2011-03-03 NOTE — Patient Instructions (Signed)
Avoid red meat/ fried foods/ egg yolks/ fatty breakfast meats/ butter, cheese and high fat dairy/ and shellfish   I'm glad you got your flu shot Try to aim at 5 days a week of exercise 20-30 min per day  This will help energy level too

## 2011-03-03 NOTE — Progress Notes (Signed)
Subjective:    Patient ID: Meredith Farmer, female    DOB: [birth year], 45 y.o.   MRN: [id number]  HPI Here for health maintenance exam and to review chronic medical problems   Stressful at work lately - issues going on  Just got off for holiday however - will go on a family   Is doing ok overall -- finally getting some sleep   bp is 118/64 Wt is up 2 lb with bmi of 25  Pap 11/12 normal at gyn   Mam 11/11- is behind on that  Is with [facility] - she wants to make her own appt  No lumps on self exam   Flu shot-- got it - 2 mo ago   Lipids up LDL 148 Lab Results  Component Value Date   CHOL 274* 02/24/2011   CHOL 224* 02/02/2010   CHOL 228* 09/24/2008   Lab Results  Component Value Date   HDL 104.80 02/24/2011   HDL 78 02/02/2010   HDL 76.10 09/24/2008   Lab Results  Component Value Date   LDLCALC 134* 02/02/2010   Lab Results  Component Value Date   TRIG 57.0 02/24/2011   TRIG 62 02/02/2010   TRIG 56.0 09/24/2008   Lab Results  Component Value Date   CHOLHDL 3 02/24/2011   CHOLHDL 2.9 Ratio 02/02/2010   CHOLHDL 3 09/24/2008   Lab Results  Component Value Date   LDLDIRECT 148.5 02/24/2011   LDLDIRECT 139.0 09/24/2008   LDLDIRECT 160.9 07/21/2008    Is not exercising regularly , no fish oil but takes ? Omega 3 will take a vitamin  LDL is high at 148 Does not eat a lot of red meat - very rarely and small amounts  Does fried foods 2 times per months   Rest of labs are good  Knows she needs to exercise   Patient Active Problem List  Diagnoses  . GOITER  . HYPERLIPIDEMIA  . DEPRESSION  . OVARIAN CYST  . HEADACHE  . Lower abdominal pain  . Hematochezia  . Routine general medical examination at a health care facility  . Gynecological examination   Past Medical History  Diagnosis Date  . Hyperlipidemia   . Depression   . Goiter    Past Surgical History  Procedure Date  . Dilation and curettage of uterus    History  Substance Use Topics  .  Smoking status: Never Smoker   . Smokeless tobacco: Not on file  . Alcohol Use: Not on file   Family History  Problem Relation Age of Onset  . Depression Mother   . Hyperlipidemia Mother   . Alcohol abuse Father   . Alcohol abuse Brother   . Cancer Paternal Aunt     colon  . Cancer Paternal Uncle     colon   No Known Allergies Current Outpatient Prescriptions on File Prior to Visit  Medication Sig Dispense Refill  . Multiple Vitamin (MULTIVITAMIN) tablet Take 1 tablet by mouth daily.            Review of Systems Review of Systems  Constitutional: Negative for fever, appetite change, and unexpected weight change. some general fatigue from work stress Eyes: Negative for pain and visual disturbance.  Respiratory: Negative for cough and shortness of breath.   Cardiovascular: Negative for cp or palpitations    Gastrointestinal: Negative for nausea, diarrhea and constipation.  Genitourinary: Negative for urgency and frequency. no vaginal discharge  Skin: Negative for pallor or rash   Neurological:  Negative for weakness, light-headedness, numbness and headaches.  Hematological: Negative for adenopathy. Does not bruise/bleed easily.  Psychiatric/Behavioral: Negative for dysphoric mood. The patient is not nervous/anxious.  pos for stress         Objective:   Physical Exam  Constitutional: She appears well-developed and well-nourished. No distress.  HENT:  Head: Normocephalic and atraumatic.  Right Ear: External ear normal.  Left Ear: External ear normal.  Nose: Nose normal.  Mouth/Throat: Oropharynx is clear and moist.  Eyes: Conjunctivae and EOM are normal. Pupils are equal, round, and reactive to light. No scleral icterus.  Neck: Normal range of motion. Neck supple. No JVD present. Carotid bruit is not present. No thyromegaly present.  Cardiovascular: Normal rate, regular rhythm and normal heart sounds.  Exam reveals no gallop.   Pulmonary/Chest: Effort normal and breath  sounds normal. No respiratory distress. She has no wheezes.  Abdominal: Soft. Bowel sounds are normal. She exhibits no distension and no mass. There is no tenderness.  Genitourinary:          Musculoskeletal: Normal range of motion. She exhibits no edema and no tenderness.  Lymphadenopathy:    She has no cervical adenopathy.  Neurological: She is alert. She has normal reflexes. No cranial nerve deficit. She exhibits normal muscle tone. Coordination normal.  Skin: Skin is warm and dry. No rash noted. No erythema. No pallor.  Psychiatric: She has a normal mood and affect.          Assessment & Plan:

## 2011-03-05 NOTE — Assessment & Plan Note (Signed)
Cholesterol is a bit high- but with good HDL Disc goals for lipids and reasons to control them Rev labs with pt Rev low sat fat diet in detail  Will continue to follow Enc exercise

## 2011-03-05 NOTE — Assessment & Plan Note (Signed)
Reviewed health habits including diet and exercise and skin cancer prevention Also reviewed health mt list, fam hx and immunizations  Rev wellness labs in detail 

## 2011-08-14 ENCOUNTER — Ambulatory Visit (INDEPENDENT_AMBULATORY_CARE_PROVIDER_SITE_OTHER)
Admission: RE | Admit: 2011-08-14 | Discharge: 2011-08-14 | Disposition: A | Discharge status: Home / Self Care | Payer: 59 | Source: Ambulatory Visit | Attending: Family Medicine | Admitting: Family Medicine

## 2011-08-14 ENCOUNTER — Ambulatory Visit (INDEPENDENT_AMBULATORY_CARE_PROVIDER_SITE_OTHER): Payer: 59 | Admitting: Family Medicine

## 2011-08-14 ENCOUNTER — Encounter: Payer: Self-pay | Admitting: Family Medicine

## 2011-08-14 VITALS — BP 108/62 | HR 94 | Temp 98.6°F | Ht 65.5 in | Wt 157.8 lb

## 2011-08-14 DIAGNOSIS — R05 Cough: Secondary | ICD-10-CM

## 2011-08-14 MED ORDER — OMEPRAZOLE 20 MG PO CPDR
20.0000 mg | DELAYED_RELEASE_CAPSULE | Freq: Every day | ORAL | Status: DC
Start: 2011-08-14 — End: 2012-12-23

## 2011-08-14 MED ORDER — BENZONATATE 200 MG PO CAPS: 200.0000 mg | ORAL_CAPSULE | Freq: Three times a day (TID) | ORAL | Status: AC | PRN

## 2011-08-14 MED ORDER — GUAIFENESIN-CODEINE 100-10 MG/5ML PO SYRP: 5.0000 mL | ORAL_SOLUTION | Freq: Every evening | ORAL | Status: AC | PRN

## 2011-08-14 NOTE — Progress Notes (Signed)
Subjective:    Patient ID: Meredith Farmer, female    DOB: 01-09-1966, 46 y.o.   MRN: [id number]  HPI Has had cough and tightness in her chest for about a month -- was intermittent and now more constant Worse at night when she lies down  Not wheezing  Feels like a "grittiness" in her chest- is uncomfortable to take a deep breath   Cough is mild in day/ much worse at night - for past week  Not prod  No nasal symptoms  No heart  Burn or stomach symptoms Is really tired and trouble getting rested    Never had asthma  No exp to chemicals or smoke   Patient Active Problem List  Diagnoses  . GOITER  . HYPERLIPIDEMIA  . DEPRESSION  . OVARIAN CYST  . HEADACHE  . Hematochezia  . Routine general medical examination at a health care facility  . Gynecological examination   Past Medical History  Diagnosis Date  . Hyperlipidemia   . Depression   . Goiter    Past Surgical History  Procedure Date  . Dilation and curettage of uterus    History  Substance Use Topics  . Smoking status: Never Smoker   . Smokeless tobacco: Never Used  . Alcohol Use: Yes     one drink once a week   Family History  Problem Relation Age of Onset  . Depression Mother   . Hyperlipidemia Mother   . Alcohol abuse Father   . Alcohol abuse Brother   . Cancer Paternal Aunt     colon  . Cancer Paternal Uncle     colon   No Known Allergies No current outpatient prescriptions on file prior to visit.      Review of Systems Review of Systems  Constitutional: Negative for fever, appetite change, fatigue and unexpected weight change.  Eyes: Negative for pain and visual disturbance.  ENT neg for post nasal drip, throat clearing/ ST or sinus pain  Respiratory: Negative for wheeze/ pos for cough and uncomfortable breathing   Cardiovascular: Negative for cp or palpitations    Gastrointestinal: Negative for nausea, diarrhea and constipation. neg for heartburn or abd pain  Genitourinary: Negative  for urgency and frequency.  Skin: Negative for pallor or rash   Neurological: Negative for weakness, light-headedness, numbness and headaches.  Hematological: Negative for adenopathy. Does not bruise/bleed easily.  Psychiatric/Behavioral: Negative for dysphoric mood. The patient is not nervous/anxious.          Objective:   Physical Exam  Constitutional: She appears well-developed and well-nourished. No distress.  HENT:  Head: Normocephalic and atraumatic.  Right Ear: External ear normal.  Left Ear: External ear normal.  Mouth/Throat: Oropharynx is clear and moist. No oropharyngeal exudate.       Nares are boggy but clear No sinus tenderness  Eyes: Conjunctivae and EOM are normal. Pupils are equal, round, and reactive to light. Right eye exhibits no discharge. Left eye exhibits no discharge.  Neck: Normal range of motion. Neck supple. No JVD present. Carotid bruit is not present. No thyromegaly present.  Cardiovascular: Normal rate, regular rhythm and normal heart sounds.  Exam reveals no gallop.   Pulmonary/Chest: Effort normal and breath sounds normal. No respiratory distress. She has no wheezes. She has no rales.       Harsh bs at bases No rales or rhonchi  No wheeze/ even on forced exp  Abdominal: Soft. Bowel sounds are normal. She exhibits no distension and no mass. There  is no tenderness.  Musculoskeletal: Normal range of motion. She exhibits no edema and no tenderness.  Lymphadenopathy:    She has no cervical adenopathy.  Neurological: She is alert. She has normal reflexes.  Skin: Skin is warm and dry. No rash noted. No pallor.  Psychiatric: She has a normal mood and affect.          Assessment & Plan:

## 2011-08-14 NOTE — Patient Instructions (Signed)
Take the tessalon in the day and the cough syrup at night for cough control  Also omeprazole each am for possible acid reflux  Chest xray now  Will update you tomorrow

## 2011-08-15 ENCOUNTER — Telehealth: Payer: Self-pay | Admitting: *Deleted

## 2011-08-15 DIAGNOSIS — R05 Cough: Secondary | ICD-10-CM

## 2011-08-15 NOTE — Telephone Encounter (Signed)
It is ok to take some ibuprofen for the headache - skip the cough syrup tonight and just use the tessalon  Let me know how she feels tomorrow

## 2011-08-15 NOTE — Telephone Encounter (Signed)
Caller: Courtni/Patient; PCP: Tower, Marne A.; CB#: [phone]; ; ; Call regarding Headache; onset on arising AM 08/15/11.  Believes that it is related to the cough medication.  Seen in office 08/14/11 and given codeine cough syrup.  Wants to know if it is safe to take ibuprofen for the headache.  Per protocol, emergent sympotms denied; advised for home care, with callback parameters given.   Also there is a note in Epic related to chest xray results.  Per Epic, "Chest xray shows some changes consistent with old lung disease- please ask pt if she has had any hx of prior lung dz/ smoke exposure or chemical exposure.  Try the medicines I gave her and let me know how she is feeling in a few days  We may need to set her up for a visit with pulmonary when she gets back from traveling (not enough time at this point)  [facility] w/contact name & number for call back at patient's convenience to discuss Xray results and MD recommendations/SLS  Patient was exposed to her father's cigarette smoking from infancy through age 10.  No personal smoking; no exposure to house fire krs/can.   INFO TO OFFICE FOR PROVIDER REVIEW/CALLBACK.  MAY REACH PATIENT AT [phone].

## 2011-08-15 NOTE — Telephone Encounter (Signed)
Notes Recorded by Judy Pimple, MD on 08/15/2011 at 7:34 AM Chest xray shows some changes consistent with old lung disease- please ask pt if she has had any hx of prior lung dz/ smoke exposure or chemical exposure  Try the medicines I gave her and let me know how she is feeling in a few days  We may need to set her up for a visit with pulmonary when she gets back from traveling (not enough time at this point)  [facility] w/contact name & number for call back at patient's convenience to discuss Xray results and MD recommendations/SLS

## 2011-08-16 NOTE — Telephone Encounter (Signed)
Thanks - I will do pulmonary ref

## 2011-08-16 NOTE — Telephone Encounter (Signed)
Patient informed; d/c cough syrup & used Nyquil last night and has had no H/A today, will continue this therapy and inform us if there are any changes/SLS Also, Ok to set up referral to Pulmonary for patient to be seen after her upcoming 2-wk trip.

## 2011-08-17 NOTE — Assessment & Plan Note (Signed)
Ongoing cough with uncomfortable "gritty" feeling breathing  ? If followed uri and cyclic cough/ poss allergic or from silent GERD CXR today showed some chronic granulomatous poss emphysema changes   (pt had childhood 2nd hand smoke exp) Will try to break the cough cycle with tessalon and codeine cough med  Also omeprazole empirically for GERD Pt would like ref to pulm for the cxr findings also Update if not starting to improve in a week or if worsening

## 2011-09-06 ENCOUNTER — Ambulatory Visit (INDEPENDENT_AMBULATORY_CARE_PROVIDER_SITE_OTHER): Payer: 59 | Admitting: Pulmonary Disease

## 2011-09-06 ENCOUNTER — Encounter: Payer: Self-pay | Admitting: Pulmonary Disease

## 2011-09-06 VITALS — BP 112/74 | HR 74 | Temp 99.0°F | Ht 65.5 in | Wt 159.2 lb

## 2011-09-06 DIAGNOSIS — R9389 Abnormal findings on diagnostic imaging of other specified body structures: Secondary | ICD-10-CM | POA: Insufficient documentation

## 2011-09-06 DIAGNOSIS — R918 Other nonspecific abnormal finding of lung field: Secondary | ICD-10-CM

## 2011-09-06 DIAGNOSIS — R05 Cough: Secondary | ICD-10-CM

## 2011-09-06 NOTE — Patient Instructions (Addendum)
Will schedule for breathing studies in the next week or so, and will call you with results. Would stay on medication for reflux at least 8 weeks before discontinuing.  If cough recurs after discontinuing, will need to consider staying on indefinitely.

## 2011-09-06 NOTE — Assessment & Plan Note (Signed)
The patient has seen an 80% improvement in her cough with treatment for reflux.  I have explained to her that LPR is the most common cause of cough in our office, and often is not associated with classic reflux symptoms.  She does state that her chest pressure has improved since being on a proton pump inhibitor.  I have asked her to try and stay on this for at least a total of 8 weeks before discontinuing.  If her cough returns, she may need to stay on this medication more long-term, or possibly need a GI evaluation.

## 2011-09-06 NOTE — Progress Notes (Signed)
  Subjective:    Patient ID: Meredith Farmer, female    DOB: January 04, 1966, 46 y.o.   MRN: [id number]  HPI The patient is a 46 year old female who I have been asked to see for an abnormal chest x-ray.  The patient recently developed a dry hacking cough, along with chest pressure/tightness.  She also feels that she is not able to get a full breath on inspiration.  The patient was treated for possible reflux, and has had a significant improvement of 80% in her cough since being on this medication.  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  The patient states that her cough and chest tightness has greatly improved as stated above, but the feeling of an inadequate depth of inspiration has continued.  The patient has never smoked, and has no history of childhood asthma.  She does not exercise regularly, and admits to having poor exertional tolerance with heavy activities.  She has seen no change in her exertional tolerance with moderate activities of daily living.  Regarding her calcified nodule, the patient states that she lived in [place] for years, and has no history of exposure to tuberculosis.  She is unsure if she has ever had a PPD.   Review of Systems  Constitutional: Negative for fever and unexpected weight change.  HENT: Negative for ear pain, nosebleeds, congestion, sore throat, rhinorrhea, sneezing, trouble swallowing, dental problem, postnasal drip and sinus pressure.   Eyes: Negative for redness and itching.  Respiratory: Positive for cough, chest tightness and shortness of breath. Negative for wheezing.   Cardiovascular: Negative for palpitations and leg swelling.  Gastrointestinal: Negative for nausea and vomiting.  Genitourinary: Negative for dysuria.  Musculoskeletal: Negative for joint swelling.  Skin: Negative for rash.  Neurological: Positive  for headaches.  Hematological: Does not bruise/bleed easily.  Psychiatric/Behavioral: Negative for dysphoric mood. The patient is not nervous/anxious.        Objective:   Physical Exam Constitutional:  Well developed, no acute distress  HENT:  Nares patent without discharge  Oropharynx without exudate, palate and uvula are normal  Eyes:  Perrla, eomi, no scleral icterus  Neck:  No JVD, no TMG  Cardiovascular:  Normal rate, regular rhythm, no rubs or gallops.  No murmurs        Intact distal pulses  Pulmonary :  Normal breath sounds, no stridor or respiratory distress   No rales, rhonchi, or wheezing  Abdominal:  Soft, nondistended, bowel sounds present.  No tenderness noted.   Musculoskeletal:  No lower extremity edema noted.  Lymph Nodes:  No cervical lymphadenopathy noted  Skin:  No cyanosis noted  Neurologic:  Alert, appropriate, moves all 4 extremities without obvious deficit.         Assessment & Plan:

## 2011-09-06 NOTE — Assessment & Plan Note (Signed)
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  Regardless of the cause, this is not an issue for her.  With respect to the hyperinflation, it is unclear whether she may have air trapping, or simply able to take a good deep breath because of her age and health.  She is concerned about this, and we'll therefore do pulmonary function studies to evaluate for occult airflow obstruction.  I will call her with results.

## 2011-09-11 ENCOUNTER — Ambulatory Visit (INDEPENDENT_AMBULATORY_CARE_PROVIDER_SITE_OTHER): Payer: 59 | Admitting: Pulmonary Disease

## 2011-09-11 DIAGNOSIS — R05 Cough: Secondary | ICD-10-CM

## 2011-09-11 LAB — PULMONARY FUNCTION TEST

## 2011-09-11 NOTE — Progress Notes (Signed)
PFT done today. 

## 2011-09-17 ENCOUNTER — Telehealth: Payer: Self-pay | Admitting: Pulmonary Disease

## 2011-09-17 NOTE — Telephone Encounter (Signed)
Meredith Farmer, let pt know that she does have mild airflow obstruction on her breathing studies, and most likely this is due to mild asthma.  I would like to start her on an inhaler that works behind the scenes to treat airway inflammation, and that she will not "feel anything" right away.    Start qvar  2 inhalations am and pm, rinse mouth Albuterol 2 inhalations up to every 6hrs for rescue only.  See if she can come by for teaching, and we may have samples.  Need to see her back in 6 weeks.

## 2011-09-21 NOTE — Telephone Encounter (Signed)
LMOMTCB x 1 

## 2011-09-22 ENCOUNTER — Encounter: Payer: Self-pay | Admitting: Pulmonary Disease

## 2011-09-25 NOTE — Telephone Encounter (Signed)
Pt returned call.  Stated that she was put on hold, waited for 15 minutes & called back.  Pt requests to be contacted at [phone] or [phone]. Meredith Farmer

## 2011-09-25 NOTE — Telephone Encounter (Signed)
Patient is aware and will come by the offie today to pick up samples and and inhaler teaching. She was told to ask for [facility]. We will then schedule her for follow-up with KC in 6 weeks.

## 2011-09-25 NOTE — Telephone Encounter (Signed)
Returning call.Meredith Farmer ° °

## 2011-09-26 NOTE — Telephone Encounter (Signed)
Pt has picked up samples of Qvar and VentolinHFA. Pt was instructed on how to use the inhalers and will call with any questions or problems. She has scheduled for follow-up with KC on  11/06/11.

## 2011-11-06 ENCOUNTER — Ambulatory Visit (INDEPENDENT_AMBULATORY_CARE_PROVIDER_SITE_OTHER): Payer: 59 | Admitting: Pulmonary Disease

## 2011-11-06 ENCOUNTER — Encounter: Payer: Self-pay | Admitting: Pulmonary Disease

## 2011-11-06 VITALS — BP 110/78 | HR 77 | Temp 97.8°F | Ht 65.5 in | Wt 158.8 lb

## 2011-11-06 DIAGNOSIS — R05 Cough: Secondary | ICD-10-CM

## 2011-11-06 DIAGNOSIS — J449 Chronic obstructive pulmonary disease, unspecified: Secondary | ICD-10-CM | POA: Insufficient documentation

## 2011-11-06 NOTE — Patient Instructions (Addendum)
Stay on qvar for now, and after rinsing, gargle and swallow.  If the voice change persists, let me know. Get back on omeprazole for possible reflux, especially since it greatly helped your cough initially. After 8 weeks, please call to give me an update with how things are going.   We will then try and sort out which medications you need or don't need.Marland Kitchen

## 2011-11-06 NOTE — Assessment & Plan Note (Signed)
The pt has a cough that sounds more upper airway in origin, but cannot exclude that it may be related to her mild airflow obstruction?  She had an 80% improvement in cough with omeprazole, but unfortunately quit taking since last visit.  Her cough is unchanged from last visit per pt.  I have asked her to get back on this, and to give [place] 8 weeks to sort thru the contribution of LPR to her cough vs airway disease.

## 2011-11-06 NOTE — Assessment & Plan Note (Signed)
The pt has mild airflow obstruction by her recent PFT's, and has been tried on qvar.  She thinks her sob is better since being on this medication, but is having issues with dysphonia.  I would like for her to continue with qvar for now, and instructed her on better rinsing technique.  It is unclear whether this is due to asthma, or if she has fixed mild airflow obstruction related to second hand smoke or pollutants.  She may not require any medication in the end.

## 2011-11-06 NOTE — Progress Notes (Signed)
  Subjective:    Patient ID: Meredith Farmer, female    DOB: 1965-12-22, 46 y.o.   MRN: [id number]  HPI Patient comes in today for followup of her dyspnea on exertion and chronic cough.  At the last visit, the patient had stated her cough improved 80% since being put on a proton pump inhibitor.  She did have some hyperinflation on her chest x-ray, and with her shortness of breath, pulmonary function studies were done.  This showed mild air flow obstruction, along with air trapping.  The patient was started on Qvar, and asked to followup with me today.  She has had some dysphonia that she is relating to the Qvar, despite rinsing well.  However, she has seen some improvement in her shortness of breath from the last visit.  She continues to have a cough that sounds more upper airway in origin, but she states it is no change from the last visit.  However, she has discontinued her proton pump inhibitor( the med she was taking and saw an 80% improvement in cough initially).    Review of Systems  Constitutional: Negative for fever and unexpected weight change.  HENT: Positive for sore throat. Negative for ear pain, nosebleeds, congestion, rhinorrhea, sneezing, trouble swallowing, dental problem, postnasal drip and sinus pressure.   Eyes: Negative for redness and itching.  Respiratory: Positive for cough and shortness of breath. Negative for chest tightness and wheezing.   Cardiovascular: Negative for palpitations and leg swelling.  Gastrointestinal: Negative for nausea and vomiting.  Genitourinary: Negative for dysuria.  Musculoskeletal: Negative for joint swelling.  Skin: Negative for rash.  Neurological: Positive for headaches.  Hematological: Bruises/bleeds easily.  Psychiatric/Behavioral: Negative for dysphoric mood. The patient is not nervous/anxious.        Objective:   Physical Exam Thin female in no acute distress Nose without purulent discharge noted. Chest totally clear to  auscultation, no wheezing Heart exam with regular rate and rhythm Lower extremities without edema, no cyanosis Alert and oriented, moves all 4 extremities.       Assessment & Plan:

## 2011-12-06 ENCOUNTER — Telehealth: Payer: Self-pay | Admitting: Pulmonary Disease

## 2011-12-06 MED ORDER — BECLOMETHASONE DIPROPIONATE 80 MCG/ACT IN AERS: 2.0000 | INHALATION_SPRAY | Freq: Two times a day (BID) | RESPIRATORY_TRACT | Status: DC

## 2011-12-06 NOTE — Telephone Encounter (Signed)
We can call in a script for her.

## 2011-12-06 NOTE — Telephone Encounter (Signed)
Pt is aware of RX and this has been sent to [facility] on [address].

## 2011-12-06 NOTE — Telephone Encounter (Signed)
Patient Instructions     Stay on qvar for now, and after rinsing, gargle and swallow. If the voice change persists, let me know.  Get back on omeprazole for possible reflux, especially since it greatly helped your cough initially.  After 8 weeks, please call to give me an update with how things are going. We will then try and sort out which medications you need or don't need.Marland Kitchen   KC Please advise; pt would like to know something today. Thanks.

## 2011-12-26 ENCOUNTER — Telehealth: Payer: Self-pay | Admitting: Family Medicine

## 2011-12-26 NOTE — Telephone Encounter (Signed)
CPE scheduled for 01/26/12, and labs on 01/23/12 pt also wanted thyroid check on lab appt and results faxed to Dr. Talmage Nap

## 2011-12-26 NOTE — Telephone Encounter (Signed)
Pt's last CPE was 03/03/2011 and she states that she wants her CPE for this year prior to that dates and says her ins will cover it.  The first thing available is 05/31/2012 at 9:30; 11:45; and 2:30 and when I told the pt this, she said that would not work for her b/c she will be penalized and have to pay higher premiums if she doesn't get her CPE before the end of the year. She says if we can't accommodate her then she will have to find someone who can and will have to change doctors. Are any times/slots you would like to schedule her? She also says she wants to be called back today and if we can't reach her at [phone] then we can try her at [phone]. Thank you.

## 2011-12-26 NOTE — Telephone Encounter (Signed)
Go ahead and put her in to any 30 min block you can come up with, thanks

## 2011-12-26 NOTE — Telephone Encounter (Signed)
Go ahead and make her appt for any 30 min slot you can come up with -thanks

## 2012-01-22 ENCOUNTER — Telehealth: Payer: Self-pay | Admitting: Family Medicine

## 2012-01-22 DIAGNOSIS — E785 Hyperlipidemia, unspecified: Secondary | ICD-10-CM

## 2012-01-22 DIAGNOSIS — Z0000 Encounter for general adult medical examination without abnormal findings: Secondary | ICD-10-CM

## 2012-01-22 NOTE — Telephone Encounter (Signed)
Message copied by Judy Pimple on Mon Jan 22, 2012  8:54 PM ------      Message from: Baldomero Lamy      Created: Fri Jan 12, 2012 10:45 AM      Regarding: Cpx labs Tues 11/12       Please order  future cpx labs for pt's upcomming lab appt. Pt also wants to have her thyroid checked.       Thanks      Rodney Booze

## 2012-01-23 ENCOUNTER — Other Ambulatory Visit (INDEPENDENT_AMBULATORY_CARE_PROVIDER_SITE_OTHER): Payer: 59

## 2012-01-23 DIAGNOSIS — Z0000 Encounter for general adult medical examination without abnormal findings: Secondary | ICD-10-CM

## 2012-01-23 DIAGNOSIS — E785 Hyperlipidemia, unspecified: Secondary | ICD-10-CM

## 2012-01-23 LAB — CBC WITH DIFFERENTIAL/PLATELET
Basophils Absolute: 0 10*3/uL (ref 0.0–0.1)
Eosinophils Absolute: 0.1 10*3/uL (ref 0.0–0.7)
Lymphocytes Relative: 31.3 % (ref 12.0–46.0)
MCHC: 33.5 g/dL (ref 30.0–36.0)
MCV: 94.7 fl (ref 78.0–100.0)
Monocytes Absolute: 0.6 10*3/uL (ref 0.1–1.0)
Neutrophils Relative %: 58 % (ref 43.0–77.0)
Platelets: 250 10*3/uL (ref 150.0–400.0)
RDW: 12.6 % (ref 11.5–14.6)

## 2012-01-23 LAB — LIPID PANEL
Total CHOL/HDL Ratio: 3
Triglycerides: 82 mg/dL (ref 0.0–149.0)

## 2012-01-23 LAB — COMPREHENSIVE METABOLIC PANEL
ALT: 10 U/L (ref 0–35)
AST: 14 U/L (ref 0–37)
Albumin: 4 g/dL (ref 3.5–5.2)
Alkaline Phosphatase: 44 U/L (ref 39–117)
BUN: 8 mg/dL (ref 6–23)
Potassium: 4.8 mEq/L (ref 3.5–5.1)
Sodium: 140 mEq/L (ref 135–145)
Total Protein: 7.1 g/dL (ref 6.0–8.3)

## 2012-01-23 LAB — TSH: TSH: 1.31 u[IU]/mL (ref 0.35–5.50)

## 2012-01-23 LAB — LDL CHOLESTEROL, DIRECT: Direct LDL: 143.4 mg/dL

## 2012-01-25 ENCOUNTER — Telehealth: Payer: Self-pay | Admitting: Internal Medicine

## 2012-01-25 NOTE — Telephone Encounter (Signed)
Spoke with pt She was last seen 11/06/11 for cough Was advised to take prilosec and qvar Her cough has pretty much completely resolved She is certain GERD was causing it Wants to know where to go from here Please advise thanks! Marland Kitchen

## 2012-01-26 ENCOUNTER — Ambulatory Visit (INDEPENDENT_AMBULATORY_CARE_PROVIDER_SITE_OTHER): Payer: 59 | Admitting: Family Medicine

## 2012-01-26 ENCOUNTER — Encounter: Payer: Self-pay | Admitting: Family Medicine

## 2012-01-26 VITALS — BP 118/80 | HR 78 | Temp 97.9°F | Ht 65.5 in | Wt 158.8 lb

## 2012-01-26 DIAGNOSIS — E785 Hyperlipidemia, unspecified: Secondary | ICD-10-CM

## 2012-01-26 DIAGNOSIS — Z0000 Encounter for general adult medical examination without abnormal findings: Secondary | ICD-10-CM

## 2012-01-26 NOTE — Assessment & Plan Note (Signed)
Reviewed health habits including diet and exercise and skin cancer prevention Also reviewed health mt list, fam hx and immunizations  Enc better diet and exercise Wellness labs reviewed Pt will schedule own mammogram  She sees gyn soon

## 2012-01-26 NOTE — Patient Instructions (Addendum)
Avoid red meat/ fried foods/ egg yolks/ fatty breakfast meats/ butter, cheese and high fat dairy/ and shellfish  Work on healthy diet and exercise  Don't forget to schedule your annual mammogram appt

## 2012-01-26 NOTE — Telephone Encounter (Signed)
Called, spoke with pt.  Informed her of below per [facility].  She verbalized understanding of this.  Pt states she just stepped out of a mtg and would like to call back later to schedule a 3 month follow up.  Will keep open to f/u that appt is scheduled.

## 2012-01-26 NOTE — Telephone Encounter (Signed)
If her cough is much better, then would stay on the acid reflux medication.  We tried her on the qvar because she had some sob, and felt it did better on qvar.  If she wants to stay off qvar and see how she does that is ok, but if her shortness of breath or cough returns, needs to restart.  Keep mouth rinsed so she wont get the hoarseness.  I would like to see her back in 3mos to see how things are going.

## 2012-01-26 NOTE — Progress Notes (Signed)
Subjective:    Patient ID: Meredith Farmer, female    DOB: Sep 02, 1965, 46 y.o.   MRN: [id number]  HPI Here for health maintenance exam and to review chronic medical problems    Is doing ok overall  Had PFTs with Dr Shelle Iron - is on Qvar and prilosec - for cough - there was concern for copd and also gerd  Her diet is not very good   Working a lot / not taking as good care of herself as she should  Wt is stable with bmi of 26 Not getting any exercise - tries to walk some on the weekends    Pap 11/12 Has that scheduled - with Dr Vickey Sages upcoming   Mammogram 11/11-not up to date  Goes to [facility] - will schedule her own appt Self exam -no lumps   Hyperlipidemia with high HDL Lab Results  Component Value Date   CHOL 251* 01/23/2012   CHOL 274* 02/24/2011   CHOL 224* 02/02/2010   Lab Results  Component Value Date   HDL 83.60 01/23/2012   HDL 104.80 02/24/2011   HDL 78 02/02/2010   Lab Results  Component Value Date   LDLCALC 134* 02/02/2010   Lab Results  Component Value Date   TRIG 82.0 01/23/2012   TRIG 57.0 02/24/2011   TRIG 62 02/02/2010   Lab Results  Component Value Date   CHOLHDL 3 01/23/2012   CHOLHDL 3 02/24/2011   CHOLHDL 2.9 Ratio 02/02/2010   Lab Results  Component Value Date   LDLDIRECT 143.4 01/23/2012   LDLDIRECT 148.5 02/24/2011   LDLDIRECT 139.0 09/24/2008  really needs to work on diet and exercise    Hx of goiter Lab Results  Component Value Date   TSH 1.31 01/23/2012     Patient Active Problem List  Diagnosis  . GOITER  . HYPERLIPIDEMIA  . DEPRESSION  . OVARIAN CYST  . HEADACHE  . Hematochezia  . Routine general medical examination at a health care facility  . Gynecological examination  . Cough  . Abnormal CXR  . COPD (chronic obstructive pulmonary disease)   Past Medical History  Diagnosis Date  . Hyperlipidemia   . Depression   . Goiter    Past Surgical History  Procedure Date  . Dilation and curettage of uterus     History  Substance Use Topics  . Smoking status: Never Smoker   . Smokeless tobacco: Never Used  . Alcohol Use: Yes     Comment: one drink once a week   Family History  Problem Relation Age of Onset  . Depression Mother   . Hyperlipidemia Mother   . Alcohol abuse Father   . Alcohol abuse Brother   . Cancer Paternal Aunt     colon  . Cancer Paternal Uncle     colon  . Heart attack Maternal Grandfather   . Diabetes Maternal Grandmother    Allergies  Allergen Reactions  . Codeine     headache   Current Outpatient Prescriptions on File Prior to Visit  Medication Sig Dispense Refill  . omeprazole (PRILOSEC) 20 MG capsule Take 1 capsule (20 mg total) by mouth daily.  30 capsule  3    Review of Systems Review of Systems  Constitutional: Negative for fever, appetite change, fatigue and unexpected weight change.  Eyes: Negative for pain and visual disturbance.  Respiratory: Negative for cough and shortness of breath.   Cardiovascular: Negative for cp or palpitations    Gastrointestinal: Negative for nausea, diarrhea  and constipation.  Genitourinary: Negative for urgency and frequency.  Skin: Negative for pallor or rash   Neurological: Negative for weakness, light-headedness, numbness and headaches.  Hematological: Negative for adenopathy. Does not bruise/bleed easily.  Psychiatric/Behavioral: Negative for dysphoric mood. The patient is not nervous/anxious.         Objective:   Physical Exam  Constitutional: She appears well-developed and well-nourished. No distress.  HENT:  Head: Normocephalic and atraumatic.  Right Ear: External ear normal.  Left Ear: External ear normal.  Nose: Nose normal.  Mouth/Throat: Oropharynx is clear and moist.  Eyes: Conjunctivae normal and EOM are normal. Pupils are equal, round, and reactive to light. Right eye exhibits no discharge. Left eye exhibits no discharge. No scleral icterus.  Neck: Normal range of motion. Neck supple. No JVD  present. Carotid bruit is not present. No thyromegaly present.  Cardiovascular: Normal rate, regular rhythm, normal heart sounds and intact distal pulses.  Exam reveals no gallop.   Pulmonary/Chest: Effort normal and breath sounds normal. No respiratory distress. She has no wheezes.  Abdominal: Soft. Bowel sounds are normal. She exhibits no distension, no abdominal bruit and no mass. There is no tenderness.  Musculoskeletal: Normal range of motion. She exhibits no edema and no tenderness.  Lymphadenopathy:    She has no cervical adenopathy.  Neurological: She is alert. She has normal reflexes. No cranial nerve deficit. She exhibits normal muscle tone. Coordination normal.  Skin: Skin is warm and dry. No rash noted. No erythema. No pallor.  Psychiatric: She has a normal mood and affect.          Assessment & Plan:

## 2012-01-26 NOTE — Assessment & Plan Note (Signed)
HDL is down a bit - otherwise stable Chol/ HDL ratio acceptable Disc goals for lipids and reasons to control them Rev labs with pt Rev low sat fat diet in detail

## 2012-02-07 ENCOUNTER — Encounter: Payer: Self-pay | Admitting: Family Medicine

## 2012-02-09 ENCOUNTER — Encounter: Payer: Self-pay | Admitting: *Deleted

## 2012-05-04 ENCOUNTER — Encounter: Payer: Self-pay | Admitting: Family Medicine

## 2012-05-04 ENCOUNTER — Ambulatory Visit (INDEPENDENT_AMBULATORY_CARE_PROVIDER_SITE_OTHER): Payer: 59 | Admitting: Family Medicine

## 2012-05-04 VITALS — BP 110/72 | HR 102 | Temp 98.4°F | Resp 16 | Wt 160.8 lb

## 2012-05-04 DIAGNOSIS — K921 Melena: Secondary | ICD-10-CM

## 2012-05-04 DIAGNOSIS — Z800 Family history of malignant neoplasm of digestive organs: Secondary | ICD-10-CM

## 2012-05-04 NOTE — Progress Notes (Signed)
Subjective:    Patient ID: Meredith Farmer, female    DOB: [birth year], 47 y.o.   MRN: [id number]  HPI  47 year old very pleasant female pt of Dr. Milinda Antis with h/o GERD presents to weekend clinic with abdominal pain, diarrhea, blood in stool.   Has had changes in her bowel habits for over a year now- episodes of loose stool, abdominal pain and blood in her stool.   This episode started yesterday- an hour or so after lunch, increased urgency, watery stool with bright red blood on paper after BM, not in water. Had two more episodes this morning- one at 2 am and one at 9:15 am.  Feels abdominal soreness, some cramping but no "pain."  No fevers or chills.  No nausea or vomiting.  The friend she ate lunch with feels fine.  No recent trip out of country. No recent antibiotics.  Has never had a colonoscopy in past. Does have family of colon cancer- father's sister and brother both died of colon CA in their 66s.  No family h/o IBD.    Saw Dr. Ermalene Searing for similar episode in 01/2011- note reviewed- stool cx were neg for shigella, Ecoli, salmonella, campylobacter, yersina bacteria negative. I did not see a C. Diff cx.  She was advised to follow up with GI but declined referral at the time.    Patient Active Problem List  Diagnosis  . GOITER  . HYPERLIPIDEMIA  . DEPRESSION  . OVARIAN CYST  . HEADACHE  . Hematochezia  . Routine general medical examination at a health care facility  . Gynecological examination  . Cough  . Abnormal CXR  . COPD (chronic obstructive pulmonary disease)   Past Medical History  Diagnosis Date  . Hyperlipidemia   . Depression   . Goiter    Past Surgical History  Procedure Laterality Date  . Dilation and curettage of uterus     History  Substance Use Topics  . Smoking status: Never Smoker   . Smokeless tobacco: Never Used  . Alcohol Use: Yes     Comment: one drink once a week   Family History  Problem Relation Age of Onset  . Depression  Mother   . Hyperlipidemia Mother   . Alcohol abuse Father   . Alcohol abuse Brother   . Cancer Paternal Aunt     colon  . Cancer Paternal Uncle     colon  . Heart attack Maternal Grandfather   . Diabetes Maternal Grandmother    Allergies  Allergen Reactions  . Codeine     headache   Current Outpatient Prescriptions on File Prior to Visit  Medication Sig Dispense Refill  . omeprazole (PRILOSEC) 20 MG capsule Take 1 capsule (20 mg total) by mouth daily.  30 capsule  3   No current facility-administered medications on file prior to visit.   The PMH, PSH, Social History, Family History, Medications, and allergies have been reviewed in [facility], and have been updated if relevant.     Review of Systems  See HPI Appetite good- weight stable. Wt Readings from Last 3 Encounters:  05/04/12 160 lb 12 oz (72.916 kg)  01/26/12 158 lb 12 oz (72.009 kg)  11/06/11 158 lb 12.8 oz (72.031 kg)         Objective:   Physical Exam  Constitutional: Vital signs are normal. She appears well-developed and well-nourished. She is cooperative.  Non-toxic appearance. She does not appear ill. No distress.  HENT:  Head: Normocephalic.  Right  Ear: Hearing, tympanic membrane and ear canal normal. Tympanic membrane is not erythematous, not retracted and not bulging.  Left Ear: Hearing, tympanic membrane, external ear and ear canal normal. Tympanic membrane is not erythematous, not retracted and not bulging.  Nose: No mucosal edema or rhinorrhea. Right sinus exhibits no maxillary sinus tenderness and no frontal sinus tenderness. Left sinus exhibits no maxillary sinus tenderness and no frontal sinus tenderness.  Mouth/Throat: Uvula is midline and mucous membranes are normal.  Eyes: Conjunctivae and lids are normal. No foreign bodies found.  Neck: Trachea normal and normal range of motion. Neck supple. Carotid bruit is not present. No mass and no thyromegaly present.  Cardiovascular: Normal rate, regular  rhythm, S1 normal, S2 normal, normal heart sounds, intact distal pulses and normal pulses.  Exam reveals no gallop and no friction rub.   No murmur heard. Pulmonary/Chest: Effort normal and breath sounds normal. Not tachypneic. No respiratory distress. She has no decreased breath sounds. She has no wheezes. She has no rhonchi. She has no rales.  Abdominal: Soft. Normal appearance and bowel sounds are normal. There is no tenderness Genitourinary: Rectum normal. Rectal exam shows no external hemorrhoid, no internal hemorrhoid, no fissure, no mass, no tenderness and anal tone normal. Guaiac pos stool.  Neurological: She is alert.  Skin: Skin is warm, dry and intact. No rash noted.  Psychiatric: Her speech is normal and behavior is normal. Judgment and thought content normal. Her mood appears not anxious. Cognition and memory are normal. She does not exhibit a depressed mood.       Assessment & Plan:  1. Hematochezia Intermittent episodes with bowel changes for over a year, maybe two years.  Unlikely infectious She needs colonoscopy at this point to rule out malignancy, IBD, internal hemorrhoids.  No fissues or external hemorrhoids appreciated on exam. Refer to GI.  Supportive care as outlined in AVS.  - Ambulatory referral to Gastroenterology  2. Family history of colon cancer  - Ambulatory referral to Gastroenterology

## 2012-05-04 NOTE — Patient Instructions (Addendum)
Nice to meet you, Meredith Farmer. We will call you from our office next week with your GI appointment.  Try to eat foods that are easy on your stomach- no coffee, wine (if possible :) ), cut back on gas producing foods- raw veggies, dairy, etc.  Please call us next week with an update of your symptoms.

## 2012-05-06 ENCOUNTER — Telehealth: Payer: Self-pay | Admitting: Family Medicine

## 2012-05-06 ENCOUNTER — Encounter: Payer: Self-pay | Admitting: Gastroenterology

## 2012-05-06 NOTE — Telephone Encounter (Signed)
[facility] Triage Call Report Triage Record Num: [id number] Operator: Bennie Hind Patient Name: Meredith Farmer Call Date & Time: 05/04/2012 9:30:36AM Patient Phone: [phone] PCP: Audrie Gallus. Tower Patient Gender: Female PCP Fax : Patient DOB: 04/28/1965 Practice Name: [facility] Reason for Call: Caller: [name]/Patient; PCP: Roxy Manns [facility]); CB#: [phone]; Call regarding Rectal bleeding ; 05-04-12 she said for past couple weeks she has had some episodes of diarrhea and had small amt of blood when she wiped she said last night and this morning she had an episode of a loose stool and when she wiped it was blood and clear fluid. She said she wiped a couple times and got same. No dizziness. She said over past several weeks she has had some vague abdominal discomfort All emergent sxs per Gastrointestinal Bleeding ruled out except for one or more episodes of rectal bleeding and no signs of hypovolemia Appt made at 1045 at Saturday clinic with Dr Clifton Custard Protocol(s) Used: Gastrointestinal Bleeding Recommended Outcome per Protocol: See Provider within 4 hours Reason for Outcome: One or more episodes of rectal bleeding (more than scant) and no symptoms of hypovolemia Care Advice: ~ 05/04/2012 9:42:38AM Page 1 of 1 [id number]

## 2012-05-07 ENCOUNTER — Ambulatory Visit (INDEPENDENT_AMBULATORY_CARE_PROVIDER_SITE_OTHER): Payer: 59 | Admitting: Gastroenterology

## 2012-05-07 ENCOUNTER — Encounter: Payer: Self-pay | Admitting: Gastroenterology

## 2012-05-07 ENCOUNTER — Other Ambulatory Visit (INDEPENDENT_AMBULATORY_CARE_PROVIDER_SITE_OTHER): Payer: 59

## 2012-05-07 VITALS — BP 118/74 | HR 76 | Ht 65.5 in | Wt 161.0 lb

## 2012-05-07 DIAGNOSIS — R194 Change in bowel habit: Secondary | ICD-10-CM

## 2012-05-07 DIAGNOSIS — R198 Other specified symptoms and signs involving the digestive system and abdomen: Secondary | ICD-10-CM

## 2012-05-07 DIAGNOSIS — K625 Hemorrhage of anus and rectum: Secondary | ICD-10-CM

## 2012-05-07 LAB — BASIC METABOLIC PANEL
CO2: 30 mEq/L (ref 19–32)
Calcium: 9 mg/dL (ref 8.4–10.5)
Chloride: 102 mEq/L (ref 96–112)
Glucose, Bld: 80 mg/dL (ref 70–99)
Potassium: 4.3 mEq/L (ref 3.5–5.1)
Sodium: 137 mEq/L (ref 135–145)

## 2012-05-07 LAB — CBC WITH DIFFERENTIAL/PLATELET
Basophils Absolute: 0 10*3/uL (ref 0.0–0.1)
Eosinophils Relative: 0.4 % (ref 0.0–5.0)
Lymphocytes Relative: 27.5 % (ref 12.0–46.0)
Monocytes Relative: 7.4 % (ref 3.0–12.0)
Neutrophils Relative %: 64.2 % (ref 43.0–77.0)
Platelets: 268 10*3/uL (ref 150.0–400.0)
RDW: 13.2 % (ref 11.5–14.6)
WBC: 5.5 10*3/uL (ref 4.5–10.5)

## 2012-05-07 MED ORDER — PEG-KCL-NACL-NASULF-NA ASC-C 100 G PO SOLR: 1.0000 | Freq: Once | ORAL | Status: DC

## 2012-05-07 NOTE — Progress Notes (Signed)
HPI: This is a  very pleasant 47 year old woman whom I am meeting for the first time today.  Red rectal bleeding, noticed 1.5 years ago, went away.  Occurred again 2 weeks ago, small amount.  Also stools looser than usual.  Yesterday bloody diarrhea, but mainly blood on TP.  Diarrhea has been intermittent as well, started 1-2 years ago, then improved but 2 months ago loose again.  Goes at least 2 loose stools per day.  Has some adominal discomfort.  No new rashes, no new joint pains.  Probable weight gain.  Fathers brother and sister with colon cancer.  Pretty rare nsaids.  Review of systems: Pertinent positive and negative review of systems were noted in the above HPI section. Complete review of systems was performed and was otherwise normal.    Past Medical History  Diagnosis Date  . Hyperlipidemia   . Depression   . Goiter     Past Surgical History  Procedure Laterality Date  . Dilation and curettage of uterus      Current Outpatient Prescriptions  Medication Sig Dispense Refill  . omeprazole (PRILOSEC) 20 MG capsule Take 1 capsule (20 mg total) by mouth daily.  30 capsule  3   No current facility-administered medications for this visit.    Allergies as of 05/07/2012 - Review Complete 05/07/2012  Allergen Reaction Noted  . Codeine  09/06/2011    Family History  Problem Relation Age of Onset  . Depression Mother   . Hyperlipidemia Mother   . Alcohol abuse Father   . Alcohol abuse Brother   . Cancer Paternal Aunt     colon  . Cancer Paternal Uncle     colon  . Heart attack Maternal Grandfather   . Diabetes Maternal Grandmother     History   Social History  . Marital Status: Married    Spouse Name: N/A    Number of Children: 1  . Years of Education: N/A   Occupational History  . creative leadership    Social History Main Topics  . Smoking status: Never Smoker   . Smokeless tobacco: Never Used  . Alcohol Use: Yes     Comment: one drink  once a week  . Drug Use: No  . Sexually Active: Not on file   Other Topics Concern  . Not on file   Social History Narrative   Exercise- no       Physical Exam: BP 118/74  Pulse 76  Ht 5' 5.5" (1.664 m)  Wt 161 lb (73.029 kg)  BMI 26.37 kg/m2  SpO2 98%  LMP 05/05/2012 Constitutional: generally well-appearing Psychiatric: alert and oriented x3 Eyes: extraocular movements intact Mouth: oral pharynx moist, no lesions Neck: supple no lymphadenopathy Cardiovascular: heart regular rate and rhythm Lungs: clear to auscultation bilaterally Abdomen: soft, nontender, nondistended, no obvious ascites, no peritoneal signs, normal bowel sounds Extremities: no lower extremity edema bilaterally Skin: no lesions on visible extremities Rectal exam was deferred for coming colonoscopy   Assessment and plan: 47 y.o. female with  looser than usual stools, intermittent rectal bleeding, mild abdominal discomfort  There are 2 family members on her father's side have had colon cancer however I think it is unlikely that she has malignancy causing the symptoms. More likely is mild colitis.  Like to proceed with colonoscopy at her soonest convenience. She will also get a basic set of labs including CBC basic metabolic profile and a sedimentation rate.

## 2012-05-07 NOTE — Patient Instructions (Addendum)
You will be set up for a colonoscopy for rectal bleeding, change in bowels (LEC moderate sedation). You will have labs checked today in the basement lab.  Please head down after you check out with the front desk  (cbc, bmet, esr). Please see the information below and sign up for MyChart which is a new service that provides you with easy online access to important information in your electronic medical chart.

## 2012-05-14 ENCOUNTER — Ambulatory Visit: Payer: 59 | Admitting: Gastroenterology

## 2012-05-20 ENCOUNTER — Other Ambulatory Visit: Payer: 59 | Admitting: Gastroenterology

## 2012-05-20 ENCOUNTER — Encounter: Payer: Self-pay | Admitting: Gastroenterology

## 2012-05-20 ENCOUNTER — Ambulatory Visit (AMBULATORY_SURGERY_CENTER): Payer: 59 | Admitting: Gastroenterology

## 2012-05-20 VITALS — BP 97/51 | HR 68 | Temp 96.9°F | Resp 16 | Ht 65.0 in | Wt 161.0 lb

## 2012-05-20 DIAGNOSIS — R933 Abnormal findings on diagnostic imaging of other parts of digestive tract: Secondary | ICD-10-CM

## 2012-05-20 DIAGNOSIS — K625 Hemorrhage of anus and rectum: Secondary | ICD-10-CM

## 2012-05-20 DIAGNOSIS — D126 Benign neoplasm of colon, unspecified: Secondary | ICD-10-CM

## 2012-05-20 DIAGNOSIS — R198 Other specified symptoms and signs involving the digestive system and abdomen: Secondary | ICD-10-CM

## 2012-05-20 MED ORDER — SODIUM CHLORIDE 0.9 % IV SOLN: 500.0000 mL | INTRAVENOUS | Status: DC

## 2012-05-20 NOTE — Progress Notes (Signed)
Patient did not have preoperative order for IV antibiotic SSI prophylaxis. (G8918)   

## 2012-05-20 NOTE — Op Note (Signed)
[facility] [address] [place], [address]   COLONOSCOPY PROCEDURE REPORT  PATIENT: Meredith, Farmer  MR#: [id number] BIRTHDATE: Aug 14, 1965 , 46  yrs. old GENDER: Female ENDOSCOPIST: Rachael Fee, MD REFERRED AO:ZHYQM Fransisca Connors, M.D. PROCEDURE DATE:  05/20/2012 PROCEDURE:   Colonoscopy with biopsy ASA CLASS:   Class II INDICATIONS:change in bowels, minor rectal bleeding. MEDICATIONS: Fentanyl 100 mcg IV, Versed 10 mg IV, and These medications were titrated to patient response per physician's verbal order  DESCRIPTION OF PROCEDURE:   After the risks benefits and alternatives of the procedure were thoroughly explained, informed consent was obtained.  A digital rectal exam revealed no abnormalities of the rectum.   The LB [id number]  endoscope was introduced through the anus and advanced to the terminal ileum which was intubated for a short distance. No adverse events experienced.   The quality of the prep was good, using MoviPrep The instrument was then slowly withdrawn as the colon was fully examined.  COLON FINDINGS: There was minor erythema in distal most 2-3cm of rectum.  This was biopsied and sent to pathology.  The examination, to the terminal ileum, was otherwise normal.  Retroflexed views revealed no abnormalities. The time to cecum=3 minutes 05 seconds. Withdrawal time=7 minutes 29 seconds.  The scope was withdrawn and the procedure completed. COMPLICATIONS: There were no complications.  ENDOSCOPIC IMPRESSION: There was minor erythema in distal most 2-3cm of rectum.  This was biopsied and sent to pathology.  The examination, to the terminal ileum, was otherwise normal.  No sign of cancer.  RECOMMENDATIONS: Await biopsy results   eSigned:  Rachael Fee, MD 05/20/2012 11:33 AM

## 2012-05-20 NOTE — Patient Instructions (Addendum)
YOU HAD AN ENDOSCOPIC PROCEDURE TODAY AT THE [facility]: Refer to the procedure report that was given to you for any specific questions about what was found during the examination.  If the procedure report does not answer your questions, please call your gastroenterologist to clarify.  If you requested that your care partner not be given the details of your procedure findings, then the procedure report has been included in a sealed envelope for you to review at your convenience later.  YOU SHOULD EXPECT: Some feelings of bloating in the abdomen. Passage of more gas than usual.  Walking can help get rid of the air that was put into your GI tract during the procedure and reduce the bloating. If you had a lower endoscopy (such as a colonoscopy or flexible sigmoidoscopy) you may notice spotting of blood in your stool or on the toilet paper. If you underwent a bowel prep for your procedure, then you may not have a normal bowel movement for a few days.  DIET: Your first meal following the procedure should be a light meal and then it is ok to progress to your normal diet.  A half-sandwich or bowl of soup is an example of a good first meal.  Heavy or fried foods are harder to digest and may make you feel nauseous or bloated.  Likewise meals heavy in dairy and vegetables can cause extra gas to form and this can also increase the bloating.  Drink plenty of fluids but you should avoid alcoholic beverages for 24 hours.  ACTIVITY: Your care partner should take you home directly after the procedure.  You should plan to take it easy, moving slowly for the rest of the day.  You can resume normal activity the day after the procedure however you should NOT DRIVE or use heavy machinery for 24 hours (because of the sedation medicines used during the test).    SYMPTOMS TO REPORT IMMEDIATELY: A gastroenterologist can be reached at any hour.  During normal business hours, 8:30 AM to 5:00 PM Monday through Friday,  call [phone].  After hours and on weekends, please call the GI answering service at [phone] who will take a message and have the physician on call contact you.   Following lower endoscopy (colonoscopy or flexible sigmoidoscopy):  Excessive amounts of blood in the stool  Significant tenderness or worsening of abdominal pains  Swelling of the abdomen that is new, acute  Fever of 100F or higher  FOLLOW UP: If any biopsies were taken you will be contacted by phone or by letter within the next 1-3 weeks.  Call your gastroenterologist if you have not heard about the biopsies in 3 weeks.  Our staff will call the home number listed on your records the next business day following your procedure to check on you and address any questions or concerns that you may have at that time regarding the information given to you following your procedure. This is a courtesy call and so if there is no answer at the home number and we have not heard from you through the emergency physician on call, we will assume that you have returned to your regular daily activities without incident.  SIGNATURES/CONFIDENTIALITY: You and/or your care partner have signed paperwork which will be entered into your electronic medical record.  These signatures attest to the fact that that the information above on your After Visit Summary has been reviewed and is understood.  Full responsibility of the confidentiality of this   discharge information lies with you and/or your care-partner.   Thank-you for choosing us for your healthcare needs. 

## 2012-05-21 ENCOUNTER — Telehealth: Payer: Self-pay

## 2012-05-21 NOTE — Telephone Encounter (Signed)
Left message

## 2012-05-28 ENCOUNTER — Encounter: Payer: Self-pay | Admitting: Gastroenterology

## 2012-12-15 ENCOUNTER — Telehealth: Payer: Self-pay | Admitting: Family Medicine

## 2012-12-15 DIAGNOSIS — Z0000 Encounter for general adult medical examination without abnormal findings: Secondary | ICD-10-CM

## 2012-12-15 NOTE — Telephone Encounter (Signed)
Message copied by Judy Pimple on Sun Dec 15, 2012  4:55 PM ------      Message from: Alvina Chou      Created: Fri Dec 06, 2012  4:13 PM      Regarding: Lab orders for, Monday, 10.6.14       Patient is scheduled for CPX labs, please order future labs, Thanks , Terri       ------

## 2012-12-16 ENCOUNTER — Other Ambulatory Visit (INDEPENDENT_AMBULATORY_CARE_PROVIDER_SITE_OTHER): Payer: 59

## 2012-12-16 DIAGNOSIS — Z0000 Encounter for general adult medical examination without abnormal findings: Secondary | ICD-10-CM

## 2012-12-16 LAB — COMPREHENSIVE METABOLIC PANEL
AST: 15 U/L (ref 0–37)
Albumin: 3.8 g/dL (ref 3.5–5.2)
CO2: 25 mEq/L (ref 19–32)
Calcium: 8.9 mg/dL (ref 8.4–10.5)
Chloride: 107 mEq/L (ref 96–112)
GFR: 85.22 mL/min (ref >60.00)
Glucose, Bld: 83 mg/dL (ref 70–99)
Potassium: 4.4 mEq/L (ref 3.5–5.1)
Sodium: 137 mEq/L (ref 135–145)
Total Bilirubin: 0.4 mg/dL (ref 0.3–1.2)
Total Protein: 6.7 g/dL (ref 6.0–8.3)

## 2012-12-16 LAB — LDL CHOLESTEROL, DIRECT: Direct LDL: 135.9 mg/dL

## 2012-12-16 LAB — LIPID PANEL
Cholesterol: 233 mg/dL — ABNORMAL HIGH (ref 0–200)
Total CHOL/HDL Ratio: 3
VLDL: 12.8 mg/dL (ref 0.0–40.0)

## 2012-12-16 LAB — CBC WITH DIFFERENTIAL/PLATELET
Basophils Relative: 0.4 % (ref 0.0–3.0)
Eosinophils Relative: 1.1 % (ref 0.0–5.0)
HCT: 37.7 % (ref 36.0–46.0)
MCV: 91.4 fl (ref 78.0–100.0)
Monocytes Absolute: 0.5 10*3/uL (ref 0.1–1.0)
Monocytes Relative: 7.7 % (ref 3.0–12.0)
Neutrophils Relative %: 52.1 % (ref 43.0–77.0)
Platelets: 250 10*3/uL (ref 150.0–400.0)
RBC: 4.12 Mil/uL (ref 3.87–5.11)
WBC: 6.1 10*3/uL (ref 4.5–10.5)

## 2012-12-16 LAB — TSH: TSH: 1.91 u[IU]/mL (ref 0.35–5.50)

## 2012-12-23 ENCOUNTER — Encounter: Payer: Self-pay | Admitting: Family Medicine

## 2012-12-23 ENCOUNTER — Ambulatory Visit (INDEPENDENT_AMBULATORY_CARE_PROVIDER_SITE_OTHER): Payer: 59 | Admitting: Family Medicine

## 2012-12-23 VITALS — BP 106/72 | HR 69 | Temp 98.8°F | Ht 65.5 in | Wt 164.5 lb

## 2012-12-23 DIAGNOSIS — Z0000 Encounter for general adult medical examination without abnormal findings: Secondary | ICD-10-CM

## 2012-12-23 DIAGNOSIS — L989 Disorder of the skin and subcutaneous tissue, unspecified: Secondary | ICD-10-CM | POA: Insufficient documentation

## 2012-12-23 DIAGNOSIS — E785 Hyperlipidemia, unspecified: Secondary | ICD-10-CM

## 2012-12-23 NOTE — Assessment & Plan Note (Signed)
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

## 2012-12-23 NOTE — Progress Notes (Signed)
Subjective:    Patient ID: Meredith Farmer, female    DOB: [birth year], 47 y.o.   MRN: [id number]  HPI Here for health maintenance exam and to review chronic medical problems   Doing well overall  Nothing new going on   Wt is up 3 lb with bmi of 26  Flu vaccine - had one at work   Mammogram 11/13 nl- will be due next month She will make her own appt at [facility]  Self exam-no lumps   Pap 11/13- nl at her gyn  Periods were a bit irregular - had skipped a few/ some extras  Last 2 mo consistent again - but heavier than it was  Some times when she gets warm - but not severe and no sweats    Td 2/07- may have had one since  Colonoscopy 3/14 nl- with 10 year recall - that went ok     Hyperlipidemia Lab Results  Component Value Date   CHOL 233* 12/16/2012   CHOL 251* 01/23/2012   CHOL 274* 02/24/2011   Lab Results  Component Value Date   HDL 84.50 12/16/2012   HDL 40.98 01/23/2012   HDL 104.80 02/24/2011   Lab Results  Component Value Date   LDLCALC 134* 02/02/2010   Lab Results  Component Value Date   TRIG 64.0 12/16/2012   TRIG 82.0 01/23/2012   TRIG 57.0 02/24/2011   Lab Results  Component Value Date   CHOLHDL 3 12/16/2012   CHOLHDL 3 01/23/2012   CHOLHDL 3 02/24/2011   Lab Results  Component Value Date   LDLDIRECT 135.9 12/16/2012   LDLDIRECT 143.4 01/23/2012   LDLDIRECT 148.5 02/24/2011   very good ratio  She eats healthy  She really needs to exercise   Patient Active Problem List   Diagnosis Date Noted  . Routine general medical examination at a health care facility 12/15/2012  . COPD (chronic obstructive pulmonary disease) 11/06/2011  . Abnormal CXR 09/06/2011  . Cough 08/14/2011  . Hematochezia 01/12/2011  . OVARIAN CYST 04/20/2008  . HYPERLIPIDEMIA 06/07/2007  . GOITER 06/06/2007  . DEPRESSION 06/06/2007   Past Medical History  Diagnosis Date  . Hyperlipidemia   . Depression   . Goiter    Past Surgical History  Procedure Laterality  Date  . Dilation and curettage of uterus     History  Substance Use Topics  . Smoking status: Never Smoker   . Smokeless tobacco: Never Used  . Alcohol Use: Yes     Comment: one drink once a week   Family History  Problem Relation Age of Onset  . Depression Mother   . Hyperlipidemia Mother   . Alcohol abuse Father   . Alcohol abuse Brother   . Cancer Paternal Aunt     colon  . Cancer Paternal Uncle     colon  . Heart attack Maternal Grandfather   . Diabetes Maternal Grandmother    Allergies  Allergen Reactions  . Codeine     headache   No current outpatient prescriptions on file prior to visit.   No current facility-administered medications on file prior to visit.    Review of Systems Review of Systems  Constitutional: Negative for fever, appetite change, fatigue and unexpected weight change.  Eyes: Negative for pain and visual disturbance.  Respiratory: Negative for cough and shortness of breath.   Cardiovascular: Negative for cp or palpitations    Gastrointestinal: Negative for nausea, diarrhea and constipation.  Genitourinary: Negative for urgency and frequency.  Skin: Negative for pallor or rash   Neurological: Negative for weakness, light-headedness, numbness and headaches.  Hematological: Negative for adenopathy. Does not bruise/bleed easily.  Psychiatric/Behavioral: Negative for dysphoric mood. The patient is not nervous/anxious.         Objective:   Physical Exam  Constitutional: She appears well-developed and well-nourished. No distress.  HENT:  Head: Normocephalic and atraumatic.  Right Ear: External ear normal.  Left Ear: External ear normal.  Nose: Nose normal.  Mouth/Throat: Oropharynx is clear and moist.  Eyes: Conjunctivae and EOM are normal. Pupils are equal, round, and reactive to light. Right eye exhibits no discharge. Left eye exhibits no discharge. No scleral icterus.  Neck: Normal range of motion. Neck supple. No JVD present. Carotid bruit  is not present. No thyromegaly present.  Cardiovascular: Normal rate, regular rhythm, normal heart sounds and intact distal pulses.  Exam reveals no gallop.   Pulmonary/Chest: Effort normal and breath sounds normal. No respiratory distress. She has no wheezes. She has no rales.  Abdominal: Soft. Bowel sounds are normal. She exhibits no distension, no abdominal bruit and no mass. There is no tenderness.  Musculoskeletal: She exhibits no edema and no tenderness.  Lymphadenopathy:    She has no cervical adenopathy.  Neurological: She is alert. She has normal reflexes. No cranial nerve deficit. She exhibits normal muscle tone. Coordination normal.  Skin: Skin is warm and dry. No rash noted. No erythema. No pallor.  3-4 mm firm skin lesion on upper post L arm- that is grey to brown in color and irregular in shape  Psychiatric: She has a normal mood and affect.          Assessment & Plan:

## 2012-12-23 NOTE — Patient Instructions (Signed)
We will do dermatology referral at check out  Take care of yourself  Work on trying to find some time for exercise

## 2012-12-23 NOTE — Assessment & Plan Note (Signed)
Disc goals for lipids and reasons to control them Rev labs with pt Rev low sat fat diet in detail HDL continues to stay high and protective

## 2012-12-23 NOTE — Assessment & Plan Note (Signed)
Reviewed health habits including diet and exercise and skin cancer prevention Also reviewed health mt list, fam hx and immunizations   Labs reviewed Enc regular exercise

## 2013-02-05 ENCOUNTER — Encounter: Payer: Self-pay | Admitting: Family Medicine

## 2013-02-07 ENCOUNTER — Encounter: Payer: Self-pay | Admitting: *Deleted

## 2014-01-02 ENCOUNTER — Telehealth: Payer: Self-pay | Admitting: Family Medicine

## 2014-01-02 NOTE — Telephone Encounter (Signed)
Pt called to scheduled her cpe. Your next available cpe slot is not until April 2016. Will you be able to accommodate pt sooner?

## 2014-01-02 NOTE — Telephone Encounter (Signed)
Please put her in a 30 min slot, thanks

## 2014-01-05 NOTE — Telephone Encounter (Signed)
Scheduled 01/20/14

## 2014-01-15 ENCOUNTER — Telehealth: Payer: Self-pay | Admitting: Family Medicine

## 2014-01-15 DIAGNOSIS — Z0000 Encounter for general adult medical examination without abnormal findings: Secondary | ICD-10-CM

## 2014-01-15 NOTE — Telephone Encounter (Signed)
-----   Message from Ellamae Sia sent at 01/07/2014  6:51 PM EDT ----- Regarding: Lab orders for Friday, 11.6.15 Patient is scheduled for CPX labs, please order future labs, Thanks , Karna Christmas

## 2014-01-16 ENCOUNTER — Other Ambulatory Visit (INDEPENDENT_AMBULATORY_CARE_PROVIDER_SITE_OTHER): Payer: 59

## 2014-01-16 DIAGNOSIS — Z0000 Encounter for general adult medical examination without abnormal findings: Secondary | ICD-10-CM

## 2014-01-16 LAB — CBC WITH DIFFERENTIAL/PLATELET
BASOS PCT: 0.4 % (ref 0.0–3.0)
Basophils Absolute: 0 10*3/uL (ref 0.0–0.1)
EOS ABS: 0.1 10*3/uL (ref 0.0–0.7)
Eosinophils Relative: 1.3 % (ref 0.0–5.0)
HEMATOCRIT: 40 % (ref 36.0–46.0)
HEMOGLOBIN: 13.4 g/dL (ref 12.0–15.0)
LYMPHS PCT: 30 % (ref 12.0–46.0)
Lymphs Abs: 1.7 10*3/uL (ref 0.7–4.0)
MCHC: 33.5 g/dL (ref 30.0–36.0)
MCV: 93.1 fl (ref 78.0–100.0)
MONO ABS: 0.5 10*3/uL (ref 0.1–1.0)
Monocytes Relative: 8.9 % (ref 3.0–12.0)
NEUTROS ABS: 3.4 10*3/uL (ref 1.4–7.7)
Neutrophils Relative %: 59.4 % (ref 43.0–77.0)
Platelets: 270 10*3/uL (ref 150.0–400.0)
RBC: 4.29 Mil/uL (ref 3.87–5.11)
RDW: 13.4 % (ref 11.5–15.5)
WBC: 5.8 10*3/uL (ref 4.0–10.5)

## 2014-01-16 LAB — COMPREHENSIVE METABOLIC PANEL
ALT: 10 U/L (ref 0–35)
AST: 14 U/L (ref 0–37)
Albumin: 3.5 g/dL (ref 3.5–5.2)
Alkaline Phosphatase: 47 U/L (ref 39–117)
BUN: 15 mg/dL (ref 6–23)
CHLORIDE: 106 meq/L (ref 96–112)
CO2: 29 mEq/L (ref 19–32)
CREATININE: 0.8 mg/dL (ref 0.4–1.2)
Calcium: 9.1 mg/dL (ref 8.4–10.5)
GFR: 81.17 mL/min (ref >60.00)
Glucose, Bld: 89 mg/dL (ref 70–99)
Potassium: 4.6 mEq/L (ref 3.5–5.1)
Sodium: 139 mEq/L (ref 135–145)
Total Bilirubin: 0.4 mg/dL (ref 0.2–1.2)
Total Protein: 6.9 g/dL (ref 6.0–8.3)

## 2014-01-16 LAB — LIPID PANEL
CHOL/HDL RATIO: 3
Cholesterol: 271 mg/dL — ABNORMAL HIGH (ref 0–200)
HDL: 86.2 mg/dL (ref >39.00)
LDL Cholesterol: 173 mg/dL — ABNORMAL HIGH (ref 0–99)
NonHDL: 184.8
Triglycerides: 57 mg/dL (ref 0.0–149.0)
VLDL: 11.4 mg/dL (ref 0.0–40.0)

## 2014-01-16 LAB — TSH: TSH: 0.9 u[IU]/mL (ref 0.35–4.50)

## 2014-01-20 ENCOUNTER — Ambulatory Visit (INDEPENDENT_AMBULATORY_CARE_PROVIDER_SITE_OTHER): Payer: 59 | Admitting: Family Medicine

## 2014-01-20 ENCOUNTER — Encounter: Payer: Self-pay | Admitting: Family Medicine

## 2014-01-20 VITALS — BP 106/62 | HR 67 | Temp 98.5°F | Ht 65.25 in | Wt 163.5 lb

## 2014-01-20 DIAGNOSIS — Z0000 Encounter for general adult medical examination without abnormal findings: Secondary | ICD-10-CM

## 2014-01-20 DIAGNOSIS — H109 Unspecified conjunctivitis: Secondary | ICD-10-CM

## 2014-01-20 DIAGNOSIS — E785 Hyperlipidemia, unspecified: Secondary | ICD-10-CM

## 2014-01-20 NOTE — Patient Instructions (Addendum)
Don't forget to schedule your mammogram  Also see your gyn for annual exam Cholesterol is up - watch diet (Avoid red meat/ fried foods/ egg yolks/ fatty breakfast meats/ butter, cheese and high fat dairy/ and shellfish ) Try to fit in some exercise  Take care of yourself  I think your conjunctivitis is mild and viral - wash hands and linens frequently , use artificial tears otc if needed and alert me if it gets worse       Fat and Cholesterol Control Diet Fat and cholesterol levels in your blood and organs are influenced by your diet. High levels of fat and cholesterol may lead to diseases of the heart, small and large blood vessels, gallbladder, liver, and pancreas. CONTROLLING FAT AND CHOLESTEROL WITH DIET Although exercise and lifestyle factors are important, your diet is key. That is because certain foods are known to raise cholesterol and others to lower it. The goal is to balance foods for their effect on cholesterol and more importantly, to replace saturated and trans fat with other types of fat, such as monounsaturated fat, polyunsaturated fat, and omega-3 fatty acids. On average, a person should consume no more than 15 to 17 g of saturated fat daily. Saturated and trans fats are considered "bad" fats, and they will raise LDL cholesterol. Saturated fats are primarily found in animal products such as meats, butter, and cream. However, that does not mean you need to give up all your favorite foods. Today, there are good tasting, low-fat, low-cholesterol substitutes for most of the things you like to eat. Choose low-fat or nonfat alternatives. Choose round or loin cuts of red meat. These types of cuts are lowest in fat and cholesterol. Chicken (without the skin), fish, veal, and ground [place] breast are great choices. Eliminate fatty meats, such as hot dogs and salami. Even shellfish have little or no saturated fat. Have a 3 oz (85 g) portion when you eat lean meat, poultry, or fish. Trans fats  are also called "partially hydrogenated oils." They are oils that have been scientifically manipulated so that they are solid at room temperature resulting in a longer shelf life and improved taste and texture of foods in which they are added. Trans fats are found in stick margarine, some tub margarines, cookies, crackers, and baked goods.  When baking and cooking, oils are a great substitute for butter. The monounsaturated oils are especially beneficial since it is believed they lower LDL and raise HDL. The oils you should avoid entirely are saturated tropical oils, such as coconut and palm.  Remember to eat a lot from food groups that are naturally free of saturated and trans fat, including fish, fruit, vegetables, beans, grains (barley, rice, couscous, bulgur wheat), and pasta (without cream sauces).  IDENTIFYING FOODS THAT LOWER FAT AND CHOLESTEROL  Soluble fiber may lower your cholesterol. This type of fiber is found in fruits such as apples, vegetables such as broccoli, potatoes, and carrots, legumes such as beans, peas, and lentils, and grains such as barley. Foods fortified with plant sterols (phytosterol) may also lower cholesterol. You should eat at least 2 g per day of these foods for a cholesterol lowering effect.  Read package labels to identify low-saturated fats, trans fat free, and low-fat foods at the supermarket. Select cheeses that have only 2 to 3 g saturated fat per ounce. Use a heart-healthy tub margarine that is free of trans fats or partially hydrogenated oil. When buying baked goods (cookies, crackers), avoid partially hydrogenated oils. Breads  and muffins should be made from whole grains (whole-wheat or whole oat flour, instead of "flour" or "enriched flour"). Buy non-creamy canned soups with reduced salt and no added fats.  FOOD PREPARATION TECHNIQUES  Never deep-fry. If you must fry, either stir-fry, which uses very little fat, or use non-stick cooking sprays. When possible, broil,  bake, or roast meats, and steam vegetables. Instead of putting butter or margarine on vegetables, use lemon and herbs, applesauce, and cinnamon (for squash and sweet potatoes). Use nonfat yogurt, salsa, and low-fat dressings for salads.  LOW-SATURATED FAT / LOW-FAT FOOD SUBSTITUTES Meats / Saturated Fat (g)  Avoid: Steak, marbled (3 oz/85 g) / 11 g  Choose: Steak, lean (3 oz/85 g) / 4 g  Avoid: Hamburger (3 oz/85 g) / 7 g  Choose: Hamburger, lean (3 oz/85 g) / 5 g  Avoid: Ham (3 oz/85 g) / 6 g  Choose: Ham, lean cut (3 oz/85 g) / 2.4 g  Avoid: Chicken, with skin, dark meat (3 oz/85 g) / 4 g  Choose: Chicken, skin removed, dark meat (3 oz/85 g) / 2 g  Avoid: Chicken, with skin, light meat (3 oz/85 g) / 2.5 g  Choose: Chicken, skin removed, light meat (3 oz/85 g) / 1 g Dairy / Saturated Fat (g)  Avoid: Whole milk (1 cup) / 5 g  Choose: Low-fat milk, 2% (1 cup) / 3 g  Choose: Low-fat milk, 1% (1 cup) / 1.5 g  Choose: Skim milk (1 cup) / 0.3 g  Avoid: Hard cheese (1 oz/28 g) / 6 g  Choose: Skim milk cheese (1 oz/28 g) / 2 to 3 g  Avoid: Cottage cheese, 4% fat (1 cup) / 6.5 g  Choose: Low-fat cottage cheese, 1% fat (1 cup) / 1.5 g  Avoid: Ice cream (1 cup) / 9 g  Choose: Sherbet (1 cup) / 2.5 g  Choose: Nonfat frozen yogurt (1 cup) / 0.3 g  Choose: Frozen fruit bar / trace  Avoid: Whipped cream (1 tbs) / 3.5 g  Choose: Nondairy whipped topping (1 tbs) / 1 g Condiments / Saturated Fat (g)  Avoid: Mayonnaise (1 tbs) / 2 g  Choose: Low-fat mayonnaise (1 tbs) / 1 g  Avoid: Butter (1 tbs) / 7 g  Choose: Extra light margarine (1 tbs) / 1 g  Avoid: Coconut oil (1 tbs) / 11.8 g  Choose: Olive oil (1 tbs) / 1.8 g  Choose: Corn oil (1 tbs) / 1.7 g  Choose: Safflower oil (1 tbs) / 1.2 g  Choose: Sunflower oil (1 tbs) / 1.4 g  Choose: Soybean oil (1 tbs) / 2.4 g  Choose: Canola oil (1 tbs) / 1 g Document Released: 02/27/2005 Document Revised: 06/24/2012  Document Reviewed: 05/28/2013 Exit[employer] Patient Information 2015 [place]. This information is not intended to replace advice given to you by your health care provider. Make sure you discuss any questions you have with your health care provider.

## 2014-01-20 NOTE — Progress Notes (Signed)
Subjective:    Patient ID: Meredith Farmer, female    DOB: 1965-11-18, 48 y.o.   MRN: [id number]  HPI Here for health maintenance exam and to review chronic medical problems    Doing pretty well overall - but thinks she may have conjunctivitis  Left eye is red (noticed last night -worse this am)  Feels a little irritated and light sensitive  A lot of kids in her house lately - ? Exposure  No cold symptoms  No allergies lately  No drainage from eye  Vision is ok   Wt is down 1 lb with bmi of 27 She never eats perfectly or gets enough exercise  Does not eat breakfast  She is really cutting soft drinks -down to once per week Also quit sweet tea    Mammogram 11/14 - she goes to [facility]  Self exam - no lumps/ does not check regularly  Flu shot 9/15 Td 2/07  Pap 11/14 - with gyn  Having some irregular menses now - perimenopause  Had some hot flashes a bit last year    colonosc 3/14 nl  10 year recall recommended   Hyperlipidemia Lab Results  Component Value Date   CHOL 271* 01/16/2014   CHOL 233* 12/16/2012   CHOL 251* 01/23/2012   Lab Results  Component Value Date   HDL 86.20 01/16/2014   HDL 84.50 12/16/2012   HDL 83.60 01/23/2012   Lab Results  Component Value Date   LDLCALC 173* 01/16/2014   LDLCALC 134* 02/02/2010   Lab Results  Component Value Date   TRIG 57.0 01/16/2014   TRIG 64.0 12/16/2012   TRIG 82.0 01/23/2012   Lab Results  Component Value Date   CHOLHDL 3 01/16/2014   CHOLHDL 3 12/16/2012   CHOLHDL 3 01/23/2012   Lab Results  Component Value Date   LDLDIRECT 135.9 12/16/2012   LDLDIRECT 143.4 01/23/2012   LDLDIRECT 148.5 02/24/2011   ? If genetic  Her mother has high cholesterol  Ratio has not changed due to excellent HDL  But LDL is up  1-2 times per week she eats bacon or ham ,  A steak 2 times per month, less fried food than she used to eat  No shellfish  Cheese - once per week , ice cream once per month  2 milk shakes per  mo     Chemistry      Component Value Date/Time   NA 139 01/16/2014 0802   K 4.6 01/16/2014 0802   CL 106 01/16/2014 0802   CO2 29 01/16/2014 0802   BUN 15 01/16/2014 0802   CREATININE 0.8 01/16/2014 0802      Component Value Date/Time   CALCIUM 9.1 01/16/2014 0802   ALKPHOS 47 01/16/2014 0802   AST 14 01/16/2014 0802   ALT 10 01/16/2014 0802   BILITOT 0.4 01/16/2014 0802     glucose 89 Lab Results  Component Value Date   WBC 5.8 01/16/2014   HGB 13.4 01/16/2014   HCT 40.0 01/16/2014   MCV 93.1 01/16/2014   PLT 270.0 01/16/2014    Lab Results  Component Value Date   TSH 0.90 01/16/2014    Has hx of a goiter- not changes that she notices    Patient Active Problem List   Diagnosis Date Noted  . Skin lesion 12/23/2012  . Routine general medical examination at a health care facility 12/15/2012  . COPD (chronic obstructive pulmonary disease) 11/06/2011  . Abnormal CXR 09/06/2011  . Hematochezia  01/12/2011  . OVARIAN CYST 04/20/2008  . Hyperlipidemia 06/07/2007  . GOITER 06/06/2007  . DEPRESSION 06/06/2007   Past Medical History  Diagnosis Date  . Hyperlipidemia   . Depression   . Goiter    Past Surgical History  Procedure Laterality Date  . Dilation and curettage of uterus     History  Substance Use Topics  . Smoking status: Never Smoker   . Smokeless tobacco: Never Used  . Alcohol Use: 0.0 oz/week    0 Not specified per week     Comment: one drink once a week   Family History  Problem Relation Age of Onset  . Depression Mother   . Hyperlipidemia Mother   . Alcohol abuse Father   . Alcohol abuse Brother   . Cancer Paternal Aunt     colon  . Cancer Paternal Uncle     colon  . Heart attack Maternal Grandfather   . Diabetes Maternal Grandmother    Allergies  Allergen Reactions  . Codeine     headache   Current Outpatient Prescriptions on File Prior to Visit  Medication Sig Dispense Refill  . Multiple Vitamins-Minerals (WOMENS MULTIVITAMIN  PLUS PO) Take by mouth.     No current facility-administered medications on file prior to visit.    Review of Systems Review of Systems  Constitutional: Negative for fever, appetite change, fatigue and unexpected weight change.  Eyes: Negative for pain and visual disturbance. pos for L eye redness and irritated feeling ENT neg for uri symptoms  Respiratory: Negative for cough and shortness of breath.   Cardiovascular: Negative for cp or palpitations    Gastrointestinal: Negative for nausea, diarrhea and constipation.  Genitourinary: Negative for urgency and frequency.  Skin: Negative for pallor or rash   Neurological: Negative for weakness, light-headedness, numbness and headaches.  Hematological: Negative for adenopathy. Does not bruise/bleed easily.  Psychiatric/Behavioral: Negative for dysphoric mood. The patient is not nervous/anxious.         Objective:   Physical Exam  Constitutional: She appears well-developed and well-nourished. No distress.  HENT:  Head: Normocephalic and atraumatic.  Right Ear: External ear normal.  Left Ear: External ear normal.  Nose: Nose normal.  Mouth/Throat: Oropharynx is clear and moist.  Eyes: EOM are normal. Pupils are equal, round, and reactive to light. Right eye exhibits no discharge. Left eye exhibits no discharge. No scleral icterus.  Scant conj injection in L eye w/o swelling or drainage  Neck: Normal range of motion. Neck supple. No JVD present. Thyromegaly present.  Baseline thyroid exam w/o change  Cardiovascular: Normal rate, regular rhythm, normal heart sounds and intact distal pulses.  Exam reveals no gallop.   Pulmonary/Chest: Effort normal and breath sounds normal. No respiratory distress. She has no wheezes. She has no rales.  Abdominal: Soft. Bowel sounds are normal. She exhibits no distension and no mass. There is no tenderness.  Musculoskeletal: She exhibits no edema or tenderness.  Lymphadenopathy:    She has no cervical  adenopathy.  Neurological: She is alert. She has normal reflexes. No cranial nerve deficit. She exhibits normal muscle tone. Coordination normal.  Skin: Skin is warm and dry. No rash noted. No erythema. No pallor.  Psychiatric: She has a normal mood and affect.          Assessment & Plan:   Problem List Items Addressed This Visit      Other   Conjunctivitis    Mild inj of L conj  Suspect viral  Disc  sympt care - and use of lubricant drops otc Disc hygiene Update if not starting to improve in a week or if worsening      Hyperlipidemia - Primary    Disc goals for lipids and reasons to control them Rev labs with pt- cholesterol is up - ? Diet caused or genetic  Rev low sat fat diet in detail Will continue to follow      Routine general medical examination at a health care facility    Reviewed health habits including diet and exercise and skin cancer prevention Reviewed appropriate screening tests for age  Also reviewed health mt list, fam hx and immunization status , as well as social and family history   See HPI Labs reviewed

## 2014-01-20 NOTE — Progress Notes (Signed)
Pre visit review using our clinic review tool, if applicable. No additional management support is needed unless otherwise documented below in the visit note. 

## 2014-01-21 NOTE — Assessment & Plan Note (Signed)
Disc goals for lipids and reasons to control them Rev labs with pt- cholesterol is up - ? Diet caused or genetic  Rev low sat fat diet in detail Will continue to follow

## 2014-01-21 NOTE — Assessment & Plan Note (Signed)
Reviewed health habits including diet and exercise and skin cancer prevention Reviewed appropriate screening tests for age  Also reviewed health mt list, fam hx and immunization status , as well as social and family history   See HPI Labs reviewed  

## 2014-01-21 NOTE — Assessment & Plan Note (Signed)
Mild inj of L conj  Suspect viral  Disc sympt care - and use of lubricant drops otc Disc hygiene Update if not starting to improve in a week or if worsening

## 2014-03-04 ENCOUNTER — Encounter: Payer: Self-pay | Admitting: Family Medicine

## 2014-03-05 ENCOUNTER — Encounter: Payer: Self-pay | Admitting: *Deleted

## 2014-05-15 ENCOUNTER — Other Ambulatory Visit: Payer: Self-pay | Admitting: Obstetrics and Gynecology

## 2014-05-19 LAB — CYTOLOGY - PAP

## 2014-07-10 ENCOUNTER — Encounter: Payer: Self-pay | Admitting: Family Medicine

## 2014-07-10 ENCOUNTER — Ambulatory Visit (INDEPENDENT_AMBULATORY_CARE_PROVIDER_SITE_OTHER): Payer: Self-pay | Admitting: Family Medicine

## 2014-07-10 ENCOUNTER — Ambulatory Visit (INDEPENDENT_AMBULATORY_CARE_PROVIDER_SITE_OTHER)
Admission: RE | Admit: 2014-07-10 | Discharge: 2014-07-10 | Disposition: A | Discharge status: Home / Self Care | Payer: Self-pay | Source: Ambulatory Visit | Attending: Family Medicine | Admitting: Family Medicine

## 2014-07-10 VITALS — BP 116/68 | HR 66 | Temp 98.5°F | Ht 65.25 in | Wt 165.8 lb

## 2014-07-10 DIAGNOSIS — R519 Headache, unspecified: Secondary | ICD-10-CM | POA: Insufficient documentation

## 2014-07-10 DIAGNOSIS — M79645 Pain in left finger(s): Secondary | ICD-10-CM

## 2014-07-10 DIAGNOSIS — R51 Headache: Secondary | ICD-10-CM

## 2014-07-10 DIAGNOSIS — G44219 Episodic tension-type headache, not intractable: Secondary | ICD-10-CM

## 2014-07-10 MED ORDER — MELOXICAM 15 MG PO TABS: 15.0000 mg | ORAL_TABLET | Freq: Every day | ORAL | Status: DC

## 2014-07-10 NOTE — Patient Instructions (Addendum)
Take meloxicam (instead of over the counter medicine) daily for finger and headache - then as needed for either Use ice on your finger as needed  Xray today  Will contact you with results   For headaches - limit caffeine on a daily basis and drink more water and try to have regular bed and wake times  Update me if worse

## 2014-07-10 NOTE — Progress Notes (Signed)
Pre visit review using our clinic review tool, if applicable. No additional management support is needed unless otherwise documented below in the visit note. 

## 2014-07-10 NOTE — Progress Notes (Signed)
Subjective:    Patient ID: Meredith Farmer, female    DOB: [birth year], 49 y.o.   MRN: [id number]  HPI Here with L middle finger pain since January  Injured it and 4th finger while sledding - bent fingers back trying to stop   Swimming recently - and it got worse again  Throbbing and swollen  ? If she broke it  4th finger a little sore but not as bad   Movement - hurts more to flex fingers and also typing   No numbness or weakness   She is also having some more headaches as she approaches menopause  About one every 2 weeks and takes advil  No other neurol symptoms  No longer has periods   Patient Active Problem List   Diagnosis Date Noted  . Conjunctivitis 01/20/2014  . Skin lesion 12/23/2012  . Routine general medical examination at a health care facility 12/15/2012  . COPD (chronic obstructive pulmonary disease) 11/06/2011  . Abnormal CXR 09/06/2011  . Hematochezia 01/12/2011  . OVARIAN CYST 04/20/2008  . Hyperlipidemia 06/07/2007  . GOITER 06/06/2007  . DEPRESSION 06/06/2007   Past Medical History  Diagnosis Date  . Hyperlipidemia   . Depression   . Goiter    Past Surgical History  Procedure Laterality Date  . Dilation and curettage of uterus     History  Substance Use Topics  . Smoking status: Never Smoker   . Smokeless tobacco: Never Used  . Alcohol Use: 0.0 oz/week    0 Standard drinks or equivalent per week     Comment: one drink once a week   Family History  Problem Relation Age of Onset  . Depression Mother   . Hyperlipidemia Mother   . Alcohol abuse Father   . Alcohol abuse Brother   . Cancer Paternal Aunt     colon  . Cancer Paternal Uncle     colon  . Heart attack Maternal Grandfather   . Diabetes Maternal Grandmother    Allergies  Allergen Reactions  . Codeine     headache   Current Outpatient Prescriptions on File Prior to Visit  Medication Sig Dispense Refill  . Multiple Vitamins-Minerals (WOMENS MULTIVITAMIN PLUS PO) Take  by mouth.     No current facility-administered medications on file prior to visit.      Review of Systems Review of Systems  Constitutional: Negative for fever, appetite change, fatigue and unexpected weight change.  Eyes: Negative for pain and visual disturbance.  Respiratory: Negative for cough and shortness of breath.   Cardiovascular: Negative for cp or palpitations    Gastrointestinal: Negative for nausea, diarrhea and constipation.  Genitourinary: Negative for urgency and frequency.  Skin: Negative for pallor or rash   Neurological: Negative for weakness, light-headedness, numbness and headaches.  Hematological: Negative for adenopathy. Does not bruise/bleed easily.  Psychiatric/Behavioral: Negative for dysphoric mood. The patient is not nervous/anxious.         Objective:   Physical Exam  Constitutional: She appears well-developed and well-nourished. No distress.  HENT:  Head: Normocephalic and atraumatic.  Mouth/Throat: Oropharynx is clear and moist.  Eyes: Conjunctivae and EOM are normal. Pupils are equal, round, and reactive to light. No scleral icterus.  Neck: Normal range of motion. Neck supple.  Cardiovascular: Normal rate and regular rhythm.   Musculoskeletal: She exhibits tenderness.  L middle finger- fullness around middle PIP joint  No crepitus Nl rom with pain on full flex and grip  Nl sens and perfusion  Lymphadenopathy:    She has no cervical adenopathy.  Neurological: She is alert. She has normal reflexes. No cranial nerve deficit. She exhibits normal muscle tone. Coordination normal.  Skin: Skin is warm. No rash noted. No erythema.  Psychiatric: She has a normal mood and affect.          Assessment & Plan:   Problem List Items Addressed This Visit      Other   Headache    More headaches with menopause  Disc lifestyle change for recurrent headache - cut caff/ more water/sleep habits  nsaid prn  Update if worse or no improvement        Relevant Medications   Ibuprofen 200 MG CAPS   meloxicam (MOBIC) 15 MG tablet   Pain in finger of left hand - Primary    After injury - remote  No deformity  xr today  Disc use of nsaid and ice and relative rest       Relevant Orders   DG Hand Complete Left (Completed)

## 2014-07-12 NOTE — Assessment & Plan Note (Signed)
More headaches with menopause  Disc lifestyle change for recurrent headache - cut caff/ more water/sleep habits  nsaid prn  Update if worse or no improvement

## 2014-07-12 NOTE — Assessment & Plan Note (Signed)
After injury - remote  No deformity  xr today  Disc use of nsaid and ice and relative rest

## 2014-07-15 ENCOUNTER — Telehealth: Payer: Self-pay | Admitting: Family Medicine

## 2014-07-15 DIAGNOSIS — M79645 Pain in left finger(s): Secondary | ICD-10-CM

## 2014-07-15 NOTE — Telephone Encounter (Signed)
I will refer

## 2014-07-15 NOTE — Telephone Encounter (Signed)
-----   Message from Tammi Sou, [place] sent at 07/14/2014  2:48 PM EDT ----- Pt notified of xray results and Dr. Marliss Coots comments. Pt does want to see a hand specialist because sxs are not improving. Pt said she checked with her ins. and she would need to either see Dr. Fredna Dow or Dr. Amedeo Plenty, pt said either one is fine

## 2015-01-27 ENCOUNTER — Other Ambulatory Visit: Payer: 59

## 2015-01-28 ENCOUNTER — Telehealth: Payer: Self-pay | Admitting: Family Medicine

## 2015-01-28 DIAGNOSIS — Z0000 Encounter for general adult medical examination without abnormal findings: Secondary | ICD-10-CM

## 2015-01-28 NOTE — Telephone Encounter (Signed)
-----   Message from Marchia Bond sent at 01/20/2015 11:29 AM EST ----- Regarding: Cpx labs Fri 11/18 need orders, thanks :-) Please order  future cpx labs for pt's upcoming lab appt. Thanks Aniceto Boss

## 2015-01-29 ENCOUNTER — Other Ambulatory Visit (INDEPENDENT_AMBULATORY_CARE_PROVIDER_SITE_OTHER): Payer: 59

## 2015-01-29 DIAGNOSIS — Z0000 Encounter for general adult medical examination without abnormal findings: Secondary | ICD-10-CM

## 2015-01-29 LAB — COMPREHENSIVE METABOLIC PANEL
ALBUMIN: 4.3 g/dL (ref 3.5–5.2)
ALK PHOS: 65 U/L (ref 39–117)
ALT: 14 U/L (ref 0–35)
AST: 17 U/L (ref 0–37)
BUN: 15 mg/dL (ref 6–23)
CO2: 27 mEq/L (ref 19–32)
Calcium: 9.8 mg/dL (ref 8.4–10.5)
Chloride: 104 mEq/L (ref 96–112)
Creatinine, Ser: 0.82 mg/dL (ref 0.40–1.20)
GFR: 78.55 mL/min (ref >60.00)
GLUCOSE: 86 mg/dL (ref 70–99)
Potassium: 4.2 mEq/L (ref 3.5–5.1)
SODIUM: 141 meq/L (ref 135–145)
Total Bilirubin: 0.4 mg/dL (ref 0.2–1.2)
Total Protein: 7.2 g/dL (ref 6.0–8.3)

## 2015-01-29 LAB — CBC WITH DIFFERENTIAL/PLATELET
BASOS ABS: 0 10*3/uL (ref 0.0–0.1)
Basophils Relative: 0.6 % (ref 0.0–3.0)
EOS PCT: 2.2 % (ref 0.0–5.0)
Eosinophils Absolute: 0.1 10*3/uL (ref 0.0–0.7)
HCT: 39.8 % (ref 36.0–46.0)
HEMOGLOBIN: 13.3 g/dL (ref 12.0–15.0)
LYMPHS ABS: 2 10*3/uL (ref 0.7–4.0)
Lymphocytes Relative: 30.4 % (ref 12.0–46.0)
MCHC: 33.3 g/dL (ref 30.0–36.0)
MCV: 92.1 fl (ref 78.0–100.0)
MONOS PCT: 8.3 % (ref 3.0–12.0)
Monocytes Absolute: 0.6 10*3/uL (ref 0.1–1.0)
NEUTROS PCT: 58.5 % (ref 43.0–77.0)
Neutro Abs: 3.9 10*3/uL (ref 1.4–7.7)
Platelets: 277 10*3/uL (ref 150.0–400.0)
RBC: 4.32 Mil/uL (ref 3.87–5.11)
RDW: 13.3 % (ref 11.5–15.5)
WBC: 6.7 10*3/uL (ref 4.0–10.5)

## 2015-01-29 LAB — LIPID PANEL
Cholesterol: 285 mg/dL — ABNORMAL HIGH (ref 0–200)
HDL: 102.7 mg/dL (ref >39.00)
LDL Cholesterol: 172 mg/dL — ABNORMAL HIGH (ref 0–99)
NONHDL: 182.41
Total CHOL/HDL Ratio: 3
Triglycerides: 51 mg/dL (ref 0.0–149.0)
VLDL: 10.2 mg/dL (ref 0.0–40.0)

## 2015-01-29 LAB — TSH: TSH: 1.77 u[IU]/mL (ref 0.35–4.50)

## 2015-02-03 ENCOUNTER — Encounter: Payer: Self-pay | Admitting: Family Medicine

## 2015-02-03 ENCOUNTER — Encounter (INDEPENDENT_AMBULATORY_CARE_PROVIDER_SITE_OTHER): Payer: Self-pay

## 2015-02-03 ENCOUNTER — Encounter: Payer: 59 | Admitting: Family Medicine

## 2015-02-03 ENCOUNTER — Ambulatory Visit (INDEPENDENT_AMBULATORY_CARE_PROVIDER_SITE_OTHER): Payer: 59 | Admitting: Family Medicine

## 2015-02-03 VITALS — BP 106/70 | HR 66 | Temp 98.7°F | Ht 65.0 in | Wt 163.0 lb

## 2015-02-03 DIAGNOSIS — Z0000 Encounter for general adult medical examination without abnormal findings: Secondary | ICD-10-CM | POA: Diagnosis not present

## 2015-02-03 DIAGNOSIS — E785 Hyperlipidemia, unspecified: Secondary | ICD-10-CM

## 2015-02-03 NOTE — Progress Notes (Signed)
Subjective:    Patient ID: Meredith Farmer, female    DOB: [birth year], 49 y.o.   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   Not enough sleep   Has had an ear ache 2 d on L side - and then a headache  Has meloxicam- does not take it often -and has not taken it for this  A little cough/nothing much  No nasal symptoms   Wt is down 2 lb with bmi of 27 No time for exercise  Drinks lots of water and eats healthy   HIV screen -not high risk /declines   Td 2/07   Flu 9/16  Pap 3/16 by gyn/normal  Sees Dr Julien Girt   Colonoscopy 3/14 was ok /neg bx Recommend 10 year recall  Has family hx of colon cancer (distant)  Hyperlipidemia Lab Results  Component Value Date   CHOL 285* 01/29/2015   CHOL 271* 01/16/2014   CHOL 233* 12/16/2012   Lab Results  Component Value Date   HDL 102.70 01/29/2015   HDL 86.20 01/16/2014   HDL 84.50 12/16/2012   Lab Results  Component Value Date   LDLCALC 172* 01/29/2015   LDLCALC 173* 01/16/2014   LDLCALC 134* 02/02/2010   Lab Results  Component Value Date   TRIG 51.0 01/29/2015   TRIG 57.0 01/16/2014   TRIG 64.0 12/16/2012   Lab Results  Component Value Date   CHOLHDL 3 01/29/2015   CHOLHDL 3 01/16/2014   CHOLHDL 3 12/16/2012   Lab Results  Component Value Date   LDLDIRECT 135.9 12/16/2012   LDLDIRECT 143.4 01/23/2012   LDLDIRECT 148.5 02/24/2011    HDL is up significantly  Does not eat a lot of fish  Not exercising a lot  LDL is stable   Results for orders placed or performed in visit on 01/29/15  CBC with Differential/Platelet  Result Value Ref Range   WBC 6.7 4.0 - 10.5 K/uL   RBC 4.32 3.87 - 5.11 Mil/uL   Hemoglobin 13.3 12.0 - 15.0 g/dL   HCT 39.8 36.0 - 46.0 %   MCV 92.1 78.0 - 100.0 fl   MCHC 33.3 30.0 - 36.0 g/dL   RDW 13.3 11.5 - 15.5 %   Platelets 277.0 150.0 - 400.0 K/uL   Neutrophils Relative %  58.5 43.0 - 77.0 %   Lymphocytes Relative 30.4 12.0 - 46.0 %   Monocytes Relative 8.3 3.0 - 12.0 %   Eosinophils Relative 2.2 0.0 - 5.0 %   Basophils Relative 0.6 0.0 - 3.0 %   Neutro Abs 3.9 1.4 - 7.7 K/uL   Lymphs Abs 2.0 0.7 - 4.0 K/uL   Monocytes Absolute 0.6 0.1 - 1.0 K/uL   Eosinophils Absolute 0.1 0.0 - 0.7 K/uL   Basophils Absolute 0.0 0.0 - 0.1 K/uL  Comprehensive metabolic panel  Result Value Ref Range   Sodium 141 135 - 145 mEq/L   Potassium 4.2 3.5 - 5.1 mEq/L   Chloride 104 96 - 112 mEq/L   CO2 27 19 - 32 mEq/L   Glucose, Bld 86 70 - 99 mg/dL   BUN 15 6 - 23 mg/dL   Creatinine, Ser 0.82 0.40 - 1.20 mg/dL   Total Bilirubin 0.4 0.2 - 1.2 mg/dL   Alkaline Phosphatase 65 39 - 117 U/L   AST 17 0 - 37 U/L   ALT 14 0 -  35 U/L   Total Protein 7.2 6.0 - 8.3 g/dL   Albumin 4.3 3.5 - 5.2 g/dL   Calcium 9.8 8.4 - 10.5 mg/dL   GFR 78.55 >60.00 mL/min  Lipid panel  Result Value Ref Range   Cholesterol 285 (H) 0 - 200 mg/dL   Triglycerides 51.0 0.0 - 149.0 mg/dL   HDL 102.70 >39.00 mg/dL   VLDL 10.2 0.0 - 40.0 mg/dL   LDL Cholesterol 172 (H) 0 - 99 mg/dL   Total CHOL/HDL Ratio 3    NonHDL 182.41   TSH  Result Value Ref Range   TSH 1.77 0.35 - 4.50 uIU/mL      Review of Systems    Review of Systems  Constitutional: Negative for fever, appetite change, fatigue and unexpected weight change.  Eyes: Negative for pain and visual disturbance.  ENT pos for intermittent sharp pain in L ear/ neg for TMJ (that she knows of) Respiratory: Negative for cough and shortness of breath.   Cardiovascular: Negative for cp or palpitations    Gastrointestinal: Negative for nausea, diarrhea and constipation.  Genitourinary: Negative for urgency and frequency.  Skin: Negative for pallor or rash   Neurological: Negative for weakness, light-headedness, numbness and headaches.  Hematological: Negative for adenopathy. Does not bruise/bleed easily.  Psychiatric/Behavioral: Negative for  dysphoric mood. The patient is not nervous/anxious.      Objective:   Physical Exam  Constitutional: She appears well-developed and well-nourished. No distress.  Well appearing   HENT:  Head: Normocephalic and atraumatic.  Right Ear: External ear normal.  Left Ear: External ear normal.  Nose: Nose normal.  Mouth/Throat: Oropharynx is clear and moist.  TMs are dull w/o erythema   Eyes: Conjunctivae and EOM are normal. Pupils are equal, round, and reactive to light. Right eye exhibits no discharge. Left eye exhibits no discharge. No scleral icterus.  Neck: Normal range of motion. Neck supple. No JVD present. Carotid bruit is not present.  Stable goiter  Cardiovascular: Normal rate, regular rhythm, normal heart sounds and intact distal pulses.  Exam reveals no gallop.   Pulmonary/Chest: Effort normal and breath sounds normal. No respiratory distress. She has no wheezes. She has no rales.  Abdominal: Soft. Bowel sounds are normal. She exhibits no distension and no mass. There is no tenderness.  Musculoskeletal: She exhibits no edema or tenderness.  Lymphadenopathy:    She has no cervical adenopathy.  Neurological: She is alert. She has normal reflexes. No cranial nerve deficit. She exhibits normal muscle tone. Coordination normal.  Skin: Skin is warm and dry. No rash noted. No erythema. No pallor.  Few lentigo   Psychiatric: She has a normal mood and affect.  Nursing note and vitals reviewed.         Assessment & Plan:   Problem List Items Addressed This Visit      Other   Hyperlipidemia    Disc goals for lipids and reasons to control them Rev labs with pt Rev low sat fat diet in detail Very high HDL LDL in 170s  Enc good diet and exercise       Routine general medical examination at a health care facility - Primary    Reviewed health habits including diet and exercise and skin cancer prevention Reviewed appropriate screening tests for age  Also reviewed health mt list,  fam hx and immunization status , as well as social and family history   See HPI Labs reviewed Disc low sat fat diet for LDL cholesterol  Enc exercise and self care

## 2015-02-03 NOTE — Patient Instructions (Addendum)
For cholesterol Avoid red meat/ fried foods/ egg yolks/ fatty breakfast meats/ butter, cheese and high fat dairy/ and shellfish    For your ear- try afrin over the counter just before takeoff and landing  meloxicam may also help  And if symptoms continue - use flonase nasal spray daily for 2-3 weeks -update if not improving   Take care of yourself

## 2015-02-07 NOTE — Assessment & Plan Note (Signed)
Disc goals for lipids and reasons to control them Rev labs with pt Rev low sat fat diet in detail Very high HDL LDL in 170s  Enc good diet and exercise

## 2015-02-07 NOTE — Assessment & Plan Note (Signed)
Reviewed health habits including diet and exercise and skin cancer prevention Reviewed appropriate screening tests for age  Also reviewed health mt list, fam hx and immunization status , as well as social and family history   See HPI Labs reviewed Disc low sat fat diet for LDL cholesterol  Enc exercise and self care

## 2015-03-10 LAB — HM MAMMOGRAPHY: HM MAMMO: NORMAL

## 2015-03-11 ENCOUNTER — Encounter: Payer: Self-pay | Admitting: Family Medicine

## 2015-03-12 ENCOUNTER — Encounter: Payer: Self-pay | Admitting: *Deleted

## 2016-01-23 ENCOUNTER — Telehealth: Payer: Self-pay | Admitting: Family Medicine

## 2016-01-23 DIAGNOSIS — Z0000 Encounter for general adult medical examination without abnormal findings: Secondary | ICD-10-CM

## 2016-01-23 NOTE — Telephone Encounter (Signed)
-----   Message from Ellamae Sia sent at 01/21/2016  3:00 PM EST ----- Regarding: Lab orders for Friday, 11.17.17 Patient is scheduled for CPX labs, please order future labs, Thanks , Karna Christmas

## 2016-01-28 ENCOUNTER — Other Ambulatory Visit (INDEPENDENT_AMBULATORY_CARE_PROVIDER_SITE_OTHER): Payer: 59

## 2016-01-28 DIAGNOSIS — Z0000 Encounter for general adult medical examination without abnormal findings: Secondary | ICD-10-CM | POA: Diagnosis not present

## 2016-01-28 DIAGNOSIS — Z23 Encounter for immunization: Secondary | ICD-10-CM | POA: Diagnosis not present

## 2016-01-28 LAB — CBC WITH DIFFERENTIAL/PLATELET
BASOS ABS: 0 10*3/uL (ref 0.0–0.1)
Basophils Relative: 0.5 % (ref 0.0–3.0)
EOS PCT: 1.3 % (ref 0.0–5.0)
Eosinophils Absolute: 0.1 10*3/uL (ref 0.0–0.7)
HEMATOCRIT: 41.7 % (ref 36.0–46.0)
Hemoglobin: 13.9 g/dL (ref 12.0–15.0)
LYMPHS ABS: 2.2 10*3/uL (ref 0.7–4.0)
LYMPHS PCT: 39.7 % (ref 12.0–46.0)
MCHC: 33.3 g/dL (ref 30.0–36.0)
MCV: 91.1 fl (ref 78.0–100.0)
MONOS PCT: 9 % (ref 3.0–12.0)
Monocytes Absolute: 0.5 10*3/uL (ref 0.1–1.0)
NEUTROS ABS: 2.8 10*3/uL (ref 1.4–7.7)
NEUTROS PCT: 49.5 % (ref 43.0–77.0)
PLATELETS: 257 10*3/uL (ref 150.0–400.0)
RBC: 4.57 Mil/uL (ref 3.87–5.11)
RDW: 13 % (ref 11.5–15.5)
WBC: 5.6 10*3/uL (ref 4.0–10.5)

## 2016-01-28 LAB — LIPID PANEL
Cholesterol: 287 mg/dL — ABNORMAL HIGH (ref 0–200)
HDL: 102.9 mg/dL (ref >39.00)
LDL Cholesterol: 172 mg/dL — ABNORMAL HIGH (ref 0–99)
NonHDL: 183.69
TRIGLYCERIDES: 60 mg/dL (ref 0.0–149.0)
Total CHOL/HDL Ratio: 3
VLDL: 12 mg/dL (ref 0.0–40.0)

## 2016-01-28 LAB — COMPREHENSIVE METABOLIC PANEL
ALK PHOS: 61 U/L (ref 39–117)
ALT: 10 U/L (ref 0–35)
AST: 14 U/L (ref 0–37)
Albumin: 4.4 g/dL (ref 3.5–5.2)
BILIRUBIN TOTAL: 0.4 mg/dL (ref 0.2–1.2)
BUN: 14 mg/dL (ref 6–23)
CALCIUM: 9.9 mg/dL (ref 8.4–10.5)
CO2: 28 mEq/L (ref 19–32)
Chloride: 104 mEq/L (ref 96–112)
Creatinine, Ser: 0.78 mg/dL (ref 0.40–1.20)
GFR: 82.88 mL/min (ref >60.00)
Glucose, Bld: 88 mg/dL (ref 70–99)
POTASSIUM: 4.3 meq/L (ref 3.5–5.1)
Sodium: 140 mEq/L (ref 135–145)
TOTAL PROTEIN: 7 g/dL (ref 6.0–8.3)

## 2016-01-28 LAB — TSH: TSH: 1.49 u[IU]/mL (ref 0.35–4.50)

## 2016-01-28 NOTE — Addendum Note (Signed)
Addended by: Marchia Bond on: 01/28/2016 10:44 AM   Modules accepted: Orders

## 2016-02-02 ENCOUNTER — Encounter: Payer: 59 | Admitting: Family Medicine

## 2016-02-09 ENCOUNTER — Encounter: Payer: Self-pay | Admitting: Family Medicine

## 2016-02-09 ENCOUNTER — Ambulatory Visit (INDEPENDENT_AMBULATORY_CARE_PROVIDER_SITE_OTHER): Payer: 59 | Admitting: Family Medicine

## 2016-02-09 VITALS — BP 114/72 | HR 71 | Temp 98.6°F | Ht 65.25 in | Wt 162.8 lb

## 2016-02-09 DIAGNOSIS — Z8249 Family history of ischemic heart disease and other diseases of the circulatory system: Secondary | ICD-10-CM | POA: Insufficient documentation

## 2016-02-09 DIAGNOSIS — E7800 Pure hypercholesterolemia, unspecified: Secondary | ICD-10-CM

## 2016-02-09 DIAGNOSIS — Z23 Encounter for immunization: Secondary | ICD-10-CM

## 2016-02-09 DIAGNOSIS — Z0000 Encounter for general adult medical examination without abnormal findings: Secondary | ICD-10-CM

## 2016-02-09 MED ORDER — ATORVASTATIN CALCIUM 10 MG PO TABS: 10.0000 mg | ORAL_TABLET | Freq: Every day | ORAL | 11 refills | Status: DC

## 2016-02-09 NOTE — Assessment & Plan Note (Signed)
Mother- with cva

## 2016-02-09 NOTE — Assessment & Plan Note (Signed)
Disc goals for lipids and reasons to control them Rev labs with pt Rev low sat fat diet in detail In light of mother's hx of carotid dz-will tx LDL with atorvastatin 10 mg daily  Rev diet Handout given  Re check 6 wk  HDL is excellent

## 2016-02-09 NOTE — Assessment & Plan Note (Addendum)
Reviewed health habits including diet and exercise and skin cancer prevention Reviewed appropriate screening tests for age  Also reviewed health mt list, fam hx and immunization status , as well as social and family history    See HPI Labs rev  Will start tx LDL chol with statin in light of mother's vasc dz hx  Enc ca and D for bone health  Tdap today Enc exercise when time for self care

## 2016-02-09 NOTE — Patient Instructions (Addendum)
For cholesterol   Avoid red meat/ fried foods/ egg yolks/ fatty breakfast meats/ butter, cheese and high fat dairy/ and shellfish   Your mammogram is due at the end of dec - don't forget to schedule   Start atorvastatin 10 mg once daily in the evening with a low fat snack If any side effects or problems stop it and alert [place]  Schedule fasting labs in 6 weeks   Try to get 1200-1500 mg of calcium per day with at least 1000 iu of vitamin D - for bone health

## 2016-02-09 NOTE — Progress Notes (Signed)
Pre visit review using our clinic review tool, if applicable. No additional management support is needed unless otherwise documented below in the visit note. 

## 2016-02-09 NOTE — Progress Notes (Signed)
Subjective:    Patient ID: Meredith Farmer, female    DOB: 1965/07/21, 50 y.o.   MRN: [id number]  HPI Here for health maintenance exam and to review chronic medical problems    A tough year  Parents are not well  Father has prostate cancer and had sinus surgery  Mother - cva and 2 seizures (carotid artery blockage)  They live in the [place] - she has to go back and forth a lot  Also working a lot -very busy   Feeling stressed/otherwise feels ok  Not much time for self care   Wt Readings from Last 3 Encounters:  02/09/16 162 lb 12 oz (73.8 kg)  02/03/15 163 lb (73.9 kg)  07/10/14 165 lb 12 oz (75.2 kg)  continues to mt her weight  No time for exercise  bmi is 26.8 Diet could be better - but she does not eat terribly   BP Readings from Last 3 Encounters:  02/09/16 114/72  02/03/15 106/70  07/10/14 116/68     HIV screening -declines due to low risk   Tetanus shot 2/07 due for one - wants that today   Some concerns about her thyroid -  Lab Results  Component Value Date   TSH 1.49 01/28/2016   no enlargement  occ feels in her armpits when she is stressed (her endocrinologist told her this could be a thyroid symptom)  Mammogram 03/10/15-normal  Self breast exam -no lumps   Pap 3/16-normal No hx of HPV or abn paps  Has appt with gyn next week -Dr Julien Girt  No periods -has been a while   Colonoscopy 3/14-10 year recall   Flu shot 11/17  Hx of high cholesterol  Lab Results  Component Value Date   CHOL 287 (H) 01/28/2016   CHOL 285 (H) 01/29/2015   CHOL 271 (H) 01/16/2014   Lab Results  Component Value Date   HDL 102.90 01/28/2016   HDL 102.70 01/29/2015   HDL 86.20 01/16/2014   Lab Results  Component Value Date   LDLCALC 172 (H) 01/28/2016   LDLCALC 172 (H) 01/29/2015   LDLCALC 173 (H) 01/16/2014   Lab Results  Component Value Date   TRIG 60.0 01/28/2016   TRIG 51.0 01/29/2015   TRIG 57.0 01/16/2014   Lab Results  Component Value Date   CHOLHDL 3 01/28/2016   CHOLHDL 3 01/29/2015   CHOLHDL 3 01/16/2014   Lab Results  Component Value Date   LDLDIRECT 135.9 12/16/2012   LDLDIRECT 143.4 01/23/2012   LDLDIRECT 148.5 02/24/2011   Diet controlled -prefers not to treat with medication  Has always had a very high HDL  Berniece Salines once per week  Cheese 2 times per week  [employer] food- chik filet - twice weekly  A little beef 2 times per month  Shellfish-not a lot   Results for orders placed or performed in visit on 01/28/16  CBC with Differential/Platelet  Result Value Ref Range   WBC 5.6 4.0 - 10.5 K/uL   RBC 4.57 3.87 - 5.11 Mil/uL   Hemoglobin 13.9 12.0 - 15.0 g/dL   HCT 41.7 36.0 - 46.0 %   MCV 91.1 78.0 - 100.0 fl   MCHC 33.3 30.0 - 36.0 g/dL   RDW 13.0 11.5 - 15.5 %   Platelets 257.0 150.0 - 400.0 K/uL   Neutrophils Relative % 49.5 43.0 - 77.0 %   Lymphocytes Relative 39.7 12.0 - 46.0 %   Monocytes Relative 9.0 3.0 - 12.0 %  Eosinophils Relative 1.3 0.0 - 5.0 %   Basophils Relative 0.5 0.0 - 3.0 %   Neutro Abs 2.8 1.4 - 7.7 K/uL   Lymphs Abs 2.2 0.7 - 4.0 K/uL   Monocytes Absolute 0.5 0.1 - 1.0 K/uL   Eosinophils Absolute 0.1 0.0 - 0.7 K/uL   Basophils Absolute 0.0 0.0 - 0.1 K/uL  Comprehensive metabolic panel  Result Value Ref Range   Sodium 140 135 - 145 mEq/L   Potassium 4.3 3.5 - 5.1 mEq/L   Chloride 104 96 - 112 mEq/L   CO2 28 19 - 32 mEq/L   Glucose, Bld 88 70 - 99 mg/dL   BUN 14 6 - 23 mg/dL   Creatinine, Ser 0.78 0.40 - 1.20 mg/dL   Total Bilirubin 0.4 0.2 - 1.2 mg/dL   Alkaline Phosphatase 61 39 - 117 U/L   AST 14 0 - 37 U/L   ALT 10 0 - 35 U/L   Total Protein 7.0 6.0 - 8.3 g/dL   Albumin 4.4 3.5 - 5.2 g/dL   Calcium 9.9 8.4 - 10.5 mg/dL   GFR 82.88 >60.00 mL/min  Lipid panel  Result Value Ref Range   Cholesterol 287 (H) 0 - 200 mg/dL   Triglycerides 60.0 0.0 - 149.0 mg/dL   HDL 102.90 >39.00 mg/dL   VLDL 12.0 0.0 - 40.0 mg/dL   LDL Cholesterol 172 (H) 0 - 99 mg/dL   Total CHOL/HDL Ratio  3    NonHDL 183.69   TSH  Result Value Ref Range   TSH 1.49 0.35 - 4.50 uIU/mL    Patient Active Problem List   Diagnosis Date Noted  . Family history of carotid artery stenosis 02/09/2016  . Headache 07/10/2014  . Skin lesion 12/23/2012  . Routine general medical examination at a health care facility 12/15/2012  . COPD (chronic obstructive pulmonary disease) ([place]) 11/06/2011  . Abnormal CXR 09/06/2011  . Hematochezia 01/12/2011  . OVARIAN CYST 04/20/2008  . Hyperlipidemia 06/07/2007  . GOITER 06/06/2007  . DEPRESSION 06/06/2007   Past Medical History:  Diagnosis Date  . Depression   . Goiter   . Hyperlipidemia    Past Surgical History:  Procedure Laterality Date  . DILATION AND CURETTAGE OF UTERUS     Social History  Substance Use Topics  . Smoking status: Never Smoker  . Smokeless tobacco: Never Used  . Alcohol use 0.0 oz/week     Comment: one drink once a week   Family History  Problem Relation Age of Onset  . Depression Mother   . Hyperlipidemia Mother   . Alcohol abuse Father   . Alcohol abuse Brother   . Cancer Paternal Aunt     colon  . Cancer Paternal Uncle     colon  . Heart attack Maternal Grandfather   . Diabetes Maternal Grandmother    Allergies  Allergen Reactions  . Codeine     Headache and upset stomach   Current Outpatient Prescriptions on File Prior to Visit  Medication Sig Dispense Refill  . Ibuprofen 200 MG CAPS Take by mouth as needed.    . meloxicam (MOBIC) 15 MG tablet Take 1 tablet (15 mg total) by mouth daily. With food as needed for pain or headache 30 tablet 3  . Multiple Vitamins-Minerals (WOMENS MULTIVITAMIN PLUS PO) Take by mouth.     No current facility-administered medications on file prior to visit.     Review of Systems Review of Systems  Constitutional: Negative for fever, appetite change,  and unexpected weight change.  Eyes: Negative for pain and visual disturbance.  Respiratory: Negative for cough and shortness of  breath.   Cardiovascular: Negative for cp or palpitations    Gastrointestinal: Negative for nausea, diarrhea and constipation.  Genitourinary: Negative for urgency and frequency.  Skin: Negative for pallor or rash   Neurological: Negative for weakness, light-headedness, numbness and headaches.  Hematological: Negative for adenopathy. Does not bruise/bleed easily.  Psychiatric/Behavioral: Negative for dysphoric mood. The patient is not nervous/anxious.  pos for stressors        Objective:   Physical Exam  Constitutional: She appears well-developed and well-nourished. No distress.  Well appearing   HENT:  Head: Normocephalic and atraumatic.  Right Ear: External ear normal.  Left Ear: External ear normal.  Mouth/Throat: Oropharynx is clear and moist.  Eyes: Conjunctivae and EOM are normal. Pupils are equal, round, and reactive to light. No scleral icterus.  Neck: Normal range of motion. Neck supple. No JVD present. Carotid bruit is not present. Thyromegaly present.  Baseline goiter w/o change  Cardiovascular: Normal rate, regular rhythm, normal heart sounds and intact distal pulses.  Exam reveals no gallop.   Pulmonary/Chest: Effort normal and breath sounds normal. No respiratory distress. She has no wheezes. She exhibits no tenderness.  Abdominal: Soft. Bowel sounds are normal. She exhibits no distension, no abdominal bruit and no mass. There is no tenderness.  Genitourinary: No breast swelling, tenderness, discharge or bleeding.  Genitourinary Comments: Breast exam: No mass, nodules, thickening, tenderness, bulging, retraction, inflamation, nipple discharge or skin changes noted.  No axillary or clavicular LA.      Musculoskeletal: Normal range of motion. She exhibits no edema or tenderness.  Lymphadenopathy:    She has no cervical adenopathy.  Neurological: She is alert. She has normal reflexes. No cranial nerve deficit. She exhibits normal muscle tone. Coordination normal.  Skin:  Skin is warm and dry. No rash noted. No erythema. No pallor.  Lentigines diffusely  Psychiatric: She has a normal mood and affect.          Assessment & Plan:   Problem List Items Addressed This Visit      Other   Routine general medical examination at a health care facility    Reviewed health habits including diet and exercise and skin cancer prevention Reviewed appropriate screening tests for age  Also reviewed health mt list, fam hx and immunization status , as well as social and family history    See HPI Labs rev  Will start tx LDL chol with statin in light of mother's vasc dz hx  Enc ca and D for bone health  Tdap today Enc exercise when time for self care       Hyperlipidemia - Primary    Disc goals for lipids and reasons to control them Rev labs with pt Rev low sat fat diet in detail In light of mother's hx of carotid dz-will tx LDL with atorvastatin 10 mg daily  Rev diet Handout given  Re check 6 wk  HDL is excellent        Relevant Medications   atorvastatin (LIPITOR) 10 MG tablet   Family history of carotid artery stenosis    Mother- with cva

## 2016-03-24 ENCOUNTER — Other Ambulatory Visit (INDEPENDENT_AMBULATORY_CARE_PROVIDER_SITE_OTHER): Payer: 59

## 2016-03-24 DIAGNOSIS — E7800 Pure hypercholesterolemia, unspecified: Secondary | ICD-10-CM

## 2016-03-24 LAB — LIPID PANEL
CHOL/HDL RATIO: 2
Cholesterol: 182 mg/dL (ref 0–200)
HDL: 87.5 mg/dL (ref >39.00)
LDL CALC: 82 mg/dL (ref 0–99)
NONHDL: 94.08
Triglycerides: 62 mg/dL (ref 0.0–149.0)
VLDL: 12.4 mg/dL (ref 0.0–40.0)

## 2016-03-24 LAB — AST: AST: 18 U/L (ref 0–37)

## 2016-03-24 LAB — ALT: ALT: 17 U/L (ref 0–35)

## 2016-03-28 ENCOUNTER — Encounter: Payer: Self-pay | Admitting: *Deleted

## 2016-10-28 ENCOUNTER — Other Ambulatory Visit: Payer: Self-pay

## 2016-10-28 ENCOUNTER — Emergency Department (HOSPITAL_COMMUNITY)
Admission: EM | Admit: 2016-10-28 | Discharge: 2016-10-28 | Disposition: A | Discharge status: Home / Self Care | Payer: 59 | Attending: Emergency Medicine | Admitting: Emergency Medicine

## 2016-10-28 ENCOUNTER — Encounter (HOSPITAL_COMMUNITY): Payer: Self-pay

## 2016-10-28 ENCOUNTER — Emergency Department (HOSPITAL_COMMUNITY): Payer: 59

## 2016-10-28 DIAGNOSIS — J449 Chronic obstructive pulmonary disease, unspecified: Secondary | ICD-10-CM | POA: Diagnosis not present

## 2016-10-28 DIAGNOSIS — R079 Chest pain, unspecified: Secondary | ICD-10-CM | POA: Diagnosis not present

## 2016-10-28 DIAGNOSIS — M79602 Pain in left arm: Secondary | ICD-10-CM | POA: Insufficient documentation

## 2016-10-28 DIAGNOSIS — M79601 Pain in right arm: Secondary | ICD-10-CM | POA: Diagnosis not present

## 2016-10-28 LAB — BASIC METABOLIC PANEL
Anion gap: 7 (ref 5–15)
BUN: 10 mg/dL (ref 6–20)
CALCIUM: 9.7 mg/dL (ref 8.9–10.3)
CO2: 27 mmol/L (ref 22–32)
Chloride: 103 mmol/L (ref 101–111)
Creatinine, Ser: 0.76 mg/dL (ref 0.44–1.00)
GFR calc Af Amer: >60 mL/min (ref >60)
GLUCOSE: 98 mg/dL (ref 65–99)
Potassium: 3.3 mmol/L — ABNORMAL LOW (ref 3.5–5.1)
Sodium: 137 mmol/L (ref 135–145)

## 2016-10-28 LAB — I-STAT TROPONIN, ED
Troponin i, poc: 0 ng/mL (ref 0.00–0.08)
Troponin i, poc: 0 ng/mL (ref 0.00–0.08)

## 2016-10-28 LAB — D-DIMER, QUANTITATIVE: D-Dimer, Quant: 0.28 ug/mL-FEU (ref 0.00–0.50)

## 2016-10-28 LAB — CBC
HEMATOCRIT: 36.7 % (ref 36.0–46.0)
Hemoglobin: 12.6 g/dL (ref 12.0–15.0)
MCH: 30.7 pg (ref 26.0–34.0)
MCHC: 34.3 g/dL (ref 30.0–36.0)
MCV: 89.3 fL (ref 78.0–100.0)
PLATELETS: 241 10*3/uL (ref 150–400)
RBC: 4.11 MIL/uL (ref 3.87–5.11)
RDW: 12.6 % (ref 11.5–15.5)
WBC: 5.5 10*3/uL (ref 4.0–10.5)

## 2016-10-28 MED ORDER — IBUPROFEN 800 MG PO TABS: 800.0000 mg | ORAL_TABLET | Freq: Three times a day (TID) | ORAL | 0 refills | Status: DC | PRN

## 2016-10-28 NOTE — Discharge Instructions (Signed)
Return here as needed.  Follow-up with her primary doctor as soon as possible

## 2016-10-28 NOTE — ED Triage Notes (Signed)
Pt states that she started having L arm pain that started yesterday, denies injury, pt states she also is having trouble taking a deep breath.

## 2016-10-28 NOTE — ED Provider Notes (Signed)
[facility] DEPT Provider Note   CSN: [id number] Arrival date & time: 10/28/16  0330     History   Chief Complaint Chief Complaint  Patient presents with  . Arm Pain  . Shortness of Breath    HPI Meredith Farmer is a 51 y.o. female.  HPI atient presents to the emergency department with left upper arm pain that started yesterday around 1.  The patient states that the pain has been constant since that time.  She states that she did take 2 ibuprofen without relief of her symptoms.  Patient states that nothing seems make the condition better, certainly movements and palpation make the pain worse.  She states that she never really had chest discomfort.  She did have a sensation of not being able to take a good deep breath.  The patient states that she has never had similar symptoms in the past.  She states that she does not have any cardiac-related issues.  She states that he will have any injuries that she is aware of. The patient denies chest pain, shortness of breath, headache,blurred vision, neck pain, fever, cough, weakness, numbness, dizziness, anorexia, edema, abdominal pain, nausea, vomiting, diarrhea, rash, back pain, dysuria, hematemesis, bloody stool, near syncope, or syncope. Past Medical History:  Diagnosis Date  . Depression   . Goiter   . Hyperlipidemia     Patient Active Problem List   Diagnosis Date Noted  . Family history of carotid artery stenosis 02/09/2016  . Headache 07/10/2014  . Routine general medical examination at a health care facility 12/15/2012  . COPD (chronic obstructive pulmonary disease) ([place]) 11/06/2011  . Abnormal CXR 09/06/2011  . OVARIAN CYST 04/20/2008  . Hyperlipidemia 06/07/2007  . GOITER 06/06/2007  . DEPRESSION 06/06/2007    Past Surgical History:  Procedure Laterality Date  . DILATION AND CURETTAGE OF UTERUS      OB History    No data available       Home Medications    Prior to Admission medications   Medication  Sig Start Date End Date Taking? Authorizing Provider  atorvastatin (LIPITOR) 10 MG tablet Take 1 tablet (10 mg total) by mouth daily. 02/09/16  Yes Tower, Wynelle Fanny, MD  ibuprofen (ADVIL,MOTRIN) 200 MG tablet Take 200-800 mg by mouth every 6 (six) hours as needed for moderate pain.   Yes [provider]  meloxicam (MOBIC) 15 MG tablet Take 1 tablet (15 mg total) by mouth daily. With food as needed for pain or headache 07/10/14  Yes Tower, Wynelle Fanny, MD    Family History Family History  Problem Relation Age of Onset  . Depression Mother   . Hyperlipidemia Mother   . Alcohol abuse Father   . Alcohol abuse Brother   . Cancer Paternal Aunt        colon  . Cancer Paternal Uncle        colon  . Heart attack Maternal Grandfather   . Diabetes Maternal Grandmother     Social History Social History  Substance Use Topics  . Smoking status: Never Smoker  . Smokeless tobacco: Never Used  . Alcohol use 0.0 oz/week     Comment: one drink once a week     Allergies   Codeine   Review of Systems Review of Systems All other systems negative except as documented in the HPI. All pertinent positives and negatives as reviewed in the HPI.  Physical Exam Updated Vital Signs BP 110/72   Pulse 72   Temp 98  F (36.7 C) (Oral)   Resp (!) 24   SpO2 100%   Physical Exam  Constitutional: She is oriented to person, place, and time. She appears well-developed and well-nourished. No distress.  HENT:  Head: Normocephalic and atraumatic.  Mouth/Throat: Oropharynx is clear and moist.  Eyes: Pupils are equal, round, and reactive to light.  Neck: Normal range of motion. Neck supple.  Cardiovascular: Normal rate, regular rhythm and normal heart sounds.  Exam reveals no gallop and no friction rub.   No murmur heard. Pulmonary/Chest: Effort normal and breath sounds normal. No respiratory distress. She has no wheezes.  Abdominal: Soft. Bowel sounds are normal. She exhibits no distension. There is  no tenderness.  Neurological: She is alert and oriented to person, place, and time. She exhibits normal muscle tone. Coordination normal.  Skin: Skin is warm and dry. Capillary refill takes less than 2 seconds. No rash noted. No erythema.  Psychiatric: She has a normal mood and affect. Her behavior is normal.  Nursing note and vitals reviewed.    ED Treatments / Results  Labs (all labs ordered are listed, but only abnormal results are displayed) Labs Reviewed  BASIC METABOLIC PANEL - Abnormal; Notable for the following:       Result Value   Potassium 3.3 (*)    All other components within normal limits  CBC  D-DIMER, QUANTITATIVE (NOT AT [facility])  I-STAT TROPONIN, ED  I-STAT TROPONIN, ED  I-STAT TROPONIN, ED    EKG  EKG Interpretation  Date/Time:  Saturday October 28 2016 07:42:55 EDT Ventricular Rate:  74 PR Interval:  164 QRS Duration: 80 QT Interval:  378 QTC Calculation: 420 R Axis:   67 Text Interpretation:  Sinus rhythm Low voltage, precordial leads nonspecific ST segments similar to earlier in the day Confirmed by Sherwood Gambler [phone]) on 10/28/2016 8:28:10 AM       Radiology Dg Chest 2 View  Result Date: 10/28/2016 CLINICAL DATA:  Acute onset of left-sided arm pain and chest tightness. Initial encounter. EXAM: CHEST  2 VIEW COMPARISON:  Chest radiograph performed 08/14/2011 FINDINGS: The lungs are well-aerated and clear. There is no evidence of focal opacification, pleural effusion or pneumothorax. A calcified granuloma is noted at the right lung base. The heart is normal in size; the mediastinal contour is within normal limits. No acute osseous abnormalities are seen. IMPRESSION: No acute cardiopulmonary process seen. Electronically Signed   By: Garald Balding M.D.   On: 10/28/2016 04:11    Procedures Procedures (including critical care time)  Medications Ordered in ED Medications - No data to display   Initial Impression / Assessment and Plan / ED Course  I  have reviewed the triage vital signs and the nursing notes.  Pertinent labs & imaging results that were available during my care of the patient were reviewed by me and considered in my medical decision making (see chart for details).    I went over the patient's laboratory testing with her and advised her that we cannot totally exclude cardiac causes for her pain in her arm.  We do feel confident with 2 negative troponins and the fact that her EKG does not show any acute changes.  I did advise her that her EKG is not completely normal and will need further follow-up with her primary doctor.  I advised her to return here for any worsening in her condition.  Patient agrees to the plan and all questions were answered.  I did advise her that if she  has any worsening to come directly back to the emergency department.   Final Clinical Impressions(s) / ED Diagnoses   Final diagnoses:  None    New Prescriptions New Prescriptions   No medications on file     Dalia Heading, Hershal Coria 10/28/16 1124    Sherwood Gambler, MD 10/29/16 [phone]

## 2016-11-20 ENCOUNTER — Telehealth: Payer: Self-pay | Admitting: Family Medicine

## 2016-11-20 DIAGNOSIS — E7800 Pure hypercholesterolemia, unspecified: Secondary | ICD-10-CM

## 2016-11-20 DIAGNOSIS — Z0000 Encounter for general adult medical examination without abnormal findings: Secondary | ICD-10-CM

## 2016-11-20 NOTE — Telephone Encounter (Signed)
-----   Message from Inocencio Homes, [place] sent at 11/17/2016 11:43 AM EDT ----- Regarding: cpe labs  Please order labs for Friday 11/24/16, thank you

## 2016-11-24 ENCOUNTER — Other Ambulatory Visit (INDEPENDENT_AMBULATORY_CARE_PROVIDER_SITE_OTHER): Payer: 59

## 2016-11-24 DIAGNOSIS — E7800 Pure hypercholesterolemia, unspecified: Secondary | ICD-10-CM

## 2016-11-24 DIAGNOSIS — Z0000 Encounter for general adult medical examination without abnormal findings: Secondary | ICD-10-CM | POA: Diagnosis not present

## 2016-11-24 LAB — CBC WITH DIFFERENTIAL/PLATELET
BASOS ABS: 0 10*3/uL (ref 0.0–0.1)
Basophils Relative: 0.6 % (ref 0.0–3.0)
EOS ABS: 0.1 10*3/uL (ref 0.0–0.7)
Eosinophils Relative: 1.1 % (ref 0.0–5.0)
HEMATOCRIT: 38 % (ref 36.0–46.0)
HEMOGLOBIN: 12.9 g/dL (ref 12.0–15.0)
LYMPHS PCT: 48.5 % — AB (ref 12.0–46.0)
Lymphs Abs: 2.5 10*3/uL (ref 0.7–4.0)
MCHC: 33.8 g/dL (ref 30.0–36.0)
MCV: 92.2 fl (ref 78.0–100.0)
MONO ABS: 0.5 10*3/uL (ref 0.1–1.0)
Monocytes Relative: 9.5 % (ref 3.0–12.0)
Neutro Abs: 2.1 10*3/uL (ref 1.4–7.7)
Neutrophils Relative %: 40.3 % — ABNORMAL LOW (ref 43.0–77.0)
PLATELETS: 249 10*3/uL (ref 150.0–400.0)
RBC: 4.12 Mil/uL (ref 3.87–5.11)
RDW: 13.2 % (ref 11.5–15.5)
WBC: 5.2 10*3/uL (ref 4.0–10.5)

## 2016-11-24 LAB — COMPREHENSIVE METABOLIC PANEL
ALBUMIN: 4.2 g/dL (ref 3.5–5.2)
ALT: 12 U/L (ref 0–35)
AST: 16 U/L (ref 0–37)
Alkaline Phosphatase: 54 U/L (ref 39–117)
BILIRUBIN TOTAL: 0.4 mg/dL (ref 0.2–1.2)
BUN: 9 mg/dL (ref 6–23)
CHLORIDE: 105 meq/L (ref 96–112)
CO2: 30 meq/L (ref 19–32)
CREATININE: 0.86 mg/dL (ref 0.40–1.20)
Calcium: 10 mg/dL (ref 8.4–10.5)
GFR: 73.81 mL/min (ref >60.00)
Glucose, Bld: 89 mg/dL (ref 70–99)
Potassium: 4.5 mEq/L (ref 3.5–5.1)
SODIUM: 141 meq/L (ref 135–145)
Total Protein: 6.8 g/dL (ref 6.0–8.3)

## 2016-11-24 LAB — LIPID PANEL
CHOL/HDL RATIO: 2
Cholesterol: 203 mg/dL — ABNORMAL HIGH (ref 0–200)
HDL: 109.2 mg/dL (ref >39.00)
LDL CALC: 77 mg/dL (ref 0–99)
NonHDL: 94.02
TRIGLYCERIDES: 83 mg/dL (ref 0.0–149.0)
VLDL: 16.6 mg/dL (ref 0.0–40.0)

## 2016-11-24 LAB — TSH: TSH: 2.34 u[IU]/mL (ref 0.35–4.50)

## 2016-11-30 DIAGNOSIS — Z23 Encounter for immunization: Secondary | ICD-10-CM | POA: Diagnosis not present

## 2016-12-01 ENCOUNTER — Ambulatory Visit (INDEPENDENT_AMBULATORY_CARE_PROVIDER_SITE_OTHER): Payer: 59 | Admitting: Family Medicine

## 2016-12-01 ENCOUNTER — Encounter: Payer: Self-pay | Admitting: Family Medicine

## 2016-12-01 ENCOUNTER — Encounter (INDEPENDENT_AMBULATORY_CARE_PROVIDER_SITE_OTHER): Payer: Self-pay

## 2016-12-01 VITALS — BP 108/68 | HR 67 | Temp 98.6°F | Ht 65.5 in | Wt 162.0 lb

## 2016-12-01 DIAGNOSIS — L989 Disorder of the skin and subcutaneous tissue, unspecified: Secondary | ICD-10-CM | POA: Diagnosis not present

## 2016-12-01 DIAGNOSIS — J449 Chronic obstructive pulmonary disease, unspecified: Secondary | ICD-10-CM | POA: Diagnosis not present

## 2016-12-01 DIAGNOSIS — E785 Hyperlipidemia, unspecified: Secondary | ICD-10-CM | POA: Diagnosis not present

## 2016-12-01 DIAGNOSIS — E7800 Pure hypercholesterolemia, unspecified: Secondary | ICD-10-CM

## 2016-12-01 DIAGNOSIS — Z0000 Encounter for general adult medical examination without abnormal findings: Secondary | ICD-10-CM

## 2016-12-01 MED ORDER — ATORVASTATIN CALCIUM 10 MG PO TABS: 10.0000 mg | ORAL_TABLET | Freq: Every day | ORAL | 11 refills | Status: DC

## 2016-12-01 MED ORDER — MELOXICAM 15 MG PO TABS: 15.0000 mg | ORAL_TABLET | Freq: Every day | ORAL | 3 refills | Status: DC

## 2016-12-01 NOTE — Patient Instructions (Addendum)
Don't forget to get your mammogram   We will refer you to dermatology for evaluation of skin lesion   Try to add exercise when you can  Glad you had your flu shot

## 2016-12-01 NOTE — Progress Notes (Signed)
Subjective:    Patient ID: Meredith Farmer, female    DOB: [birth year], 51 y.o.   MRN: [id number]  HPI Here for health maintenance exam and to review chronic medical problems    Doing ok  No time for self care   Wt Readings from Last 3 Encounters:  12/01/16 162 lb (73.5 kg)  02/09/16 162 lb 12 oz (73.8 kg)  02/03/15 163 lb (73.9 kg)  has mt her weight  No time for exercise  She did cut out most of her sweets and sodas  26.55 kg/m   mammogram 12/16 normal -has one scheduled for October  Self breast exam   Flu shot -had it at work yesterday   Pap 3/16 Has not had her next one since then/ has not made annual appt  Sees Dr Julien Girt for gyb  Colonoscopy 3/14 with a 10 y recall Dr Ardis Hughs  Tdap 11/17  Hyperlipidemia Lab Results  Component Value Date   CHOL 203 (H) 11/24/2016   CHOL 182 03/24/2016   CHOL 287 (H) 01/28/2016   Lab Results  Component Value Date   HDL 109.20 11/24/2016   HDL 87.50 03/24/2016   HDL 102.90 01/28/2016   Lab Results  Component Value Date   LDLCALC 77 11/24/2016   LDLCALC 82 03/24/2016   LDLCALC 172 (H) 01/28/2016   Lab Results  Component Value Date   TRIG 83.0 11/24/2016   TRIG 62.0 03/24/2016   TRIG 60.0 01/28/2016   Lab Results  Component Value Date   CHOLHDL 2 11/24/2016   CHOLHDL 2 03/24/2016   CHOLHDL 3 01/28/2016   Lab Results  Component Value Date   LDLDIRECT 135.9 12/16/2012   LDLDIRECT 143.4 01/23/2012   LDLDIRECT 148.5 02/24/2011   On atorvastatin and diet  Diet is improved /sugar wise  Labs: Results for orders placed or performed in visit on 11/24/16  CBC with Differential/Platelet  Result Value Ref Range   WBC 5.2 4.0 - 10.5 K/uL   RBC 4.12 3.87 - 5.11 Mil/uL   Hemoglobin 12.9 12.0 - 15.0 g/dL   HCT 38.0 36.0 - 46.0 %   MCV 92.2 78.0 - 100.0 fl   MCHC 33.8 30.0 - 36.0 g/dL   RDW 13.2 11.5 - 15.5 %   Platelets 249.0 150.0 - 400.0 K/uL   Neutrophils Relative % 40.3 (L) 43.0 - 77.0 %   Lymphocytes  Relative 48.5 (H) 12.0 - 46.0 %   Monocytes Relative 9.5 3.0 - 12.0 %   Eosinophils Relative 1.1 0.0 - 5.0 %   Basophils Relative 0.6 0.0 - 3.0 %   Neutro Abs 2.1 1.4 - 7.7 K/uL   Lymphs Abs 2.5 0.7 - 4.0 K/uL   Monocytes Absolute 0.5 0.1 - 1.0 K/uL   Eosinophils Absolute 0.1 0.0 - 0.7 K/uL   Basophils Absolute 0.0 0.0 - 0.1 K/uL  Comprehensive metabolic panel  Result Value Ref Range   Sodium 141 135 - 145 mEq/L   Potassium 4.5 3.5 - 5.1 mEq/L   Chloride 105 96 - 112 mEq/L   CO2 30 19 - 32 mEq/L   Glucose, Bld 89 70 - 99 mg/dL   BUN 9 6 - 23 mg/dL   Creatinine, Ser 0.86 0.40 - 1.20 mg/dL   Total Bilirubin 0.4 0.2 - 1.2 mg/dL   Alkaline Phosphatase 54 39 - 117 U/L   AST 16 0 - 37 U/L   ALT 12 0 - 35 U/L   Total Protein 6.8 6.0 - 8.3 g/dL  Albumin 4.2 3.5 - 5.2 g/dL   Calcium 10.0 8.4 - 10.5 mg/dL   GFR 73.81 >60.00 mL/min  Lipid panel  Result Value Ref Range   Cholesterol 203 (H) 0 - 200 mg/dL   Triglycerides 83.0 0.0 - 149.0 mg/dL   HDL 109.20 >39.00 mg/dL   VLDL 16.6 0.0 - 40.0 mg/dL   LDL Cholesterol 77 0 - 99 mg/dL   Total CHOL/HDL Ratio 2    NonHDL 94.02   TSH  Result Value Ref Range   TSH 2.34 0.35 - 4.50 uIU/mL           Review of Systems  Constitutional: Negative for activity change, appetite change, fatigue, fever and unexpected weight change.  HENT: Negative for congestion, ear pain, rhinorrhea, sinus pressure and sore throat.   Eyes: Negative for pain, redness and visual disturbance.  Respiratory: Negative for cough, shortness of breath and wheezing.   Cardiovascular: Negative for chest pain and palpitations.  Gastrointestinal: Negative for abdominal pain, blood in stool, constipation and diarrhea.  Endocrine: Negative for polydipsia and polyuria.  Genitourinary: Negative for dysuria, frequency and urgency.       Pos for pain in both axillae occ for about 1/2 day    Musculoskeletal: Negative for arthralgias, back pain and myalgias.  Skin: Negative  for pallor and rash.  Allergic/Immunologic: Negative for environmental allergies.  Neurological: Positive for weakness. Negative for dizziness, syncope and headaches.  Hematological: Negative for adenopathy. Does not bruise/bleed easily.  Psychiatric/Behavioral: Negative for decreased concentration and dysphoric mood. The patient is not nervous/anxious.        Objective:   Physical Exam  Constitutional: She appears well-developed and well-nourished. No distress.  Well appearing   HENT:  Head: Normocephalic and atraumatic.  Right Ear: External ear normal.  Left Ear: External ear normal.  Mouth/Throat: Oropharynx is clear and moist.  Eyes: Pupils are equal, round, and reactive to light. Conjunctivae and EOM are normal. No scleral icterus.  Neck: Normal range of motion. Neck supple. No JVD present. Carotid bruit is not present. No thyromegaly present.  No change in thyroid  exam   Cardiovascular: Normal rate, regular rhythm, normal heart sounds and intact distal pulses.  Exam reveals no gallop.   Pulmonary/Chest: Effort normal and breath sounds normal. No respiratory distress. She has no wheezes. She exhibits no tenderness.  Abdominal: Soft. Bowel sounds are normal. She exhibits no distension, no abdominal bruit and no mass. There is no tenderness.  Genitourinary: No breast swelling, tenderness, discharge or bleeding.  Genitourinary Comments: Breast exam: No mass, nodules, thickening, tenderness, bulging, retraction, inflamation, nipple discharge or skin changes noted.  No axillary or clavicular LA.     No tenderness or abn of axillae  Musculoskeletal: Normal range of motion. She exhibits no edema or tenderness.  Lymphadenopathy:    She has no cervical adenopathy.  Neurological: She is alert. She has normal reflexes. No cranial nerve deficit. She exhibits normal muscle tone. Coordination normal.  Skin: Skin is warm and dry. No rash noted. No erythema. No pallor.  Some lentigines and skin  tags   3-4 mm skin lesion L post shoulder- brown with firm texture that does dimple when squeezed - resembled dermatofibroma or scar  Psychiatric: She has a normal mood and affect.          Assessment & Plan:   Problem List Items Addressed This Visit      Musculoskeletal and Integument   Skin lesion    Post L shoulder resembled  dermatofibroma or scar  Ref to dermatology  occ bothersome        Relevant Orders   Ambulatory referral to Dermatology     Other   Hyperlipidemia    Disc goals for lipids and reasons to control them Rev labs with pt Rev low sat fat diet in detail Some improvement with diet  Continue atorvastatin and diet       Relevant Medications   atorvastatin (LIPITOR) 10 MG tablet   Routine general medical examination at a health care facility - Primary    Reviewed health habits including diet and exercise and skin cancer prevention Reviewed appropriate screening tests for age  Also reviewed health mt list, fam hx and immunization status , as well as social and family history   See HPI Labs reviewed  Enc exercise and time for self care when able  Ref to derm for skin exam

## 2016-12-02 NOTE — Assessment & Plan Note (Signed)
No c/o about breathing

## 2016-12-02 NOTE — Assessment & Plan Note (Signed)
Reviewed health habits including diet and exercise and skin cancer prevention Reviewed appropriate screening tests for age  Also reviewed health mt list, fam hx and immunization status , as well as social and family history   See HPI Labs reviewed  Enc exercise and time for self care when able  Ref to derm for skin exam

## 2016-12-02 NOTE — Assessment & Plan Note (Signed)
Disc goals for lipids and reasons to control them Rev labs with pt Rev low sat fat diet in detail Some improvement with diet  Continue atorvastatin and diet

## 2016-12-02 NOTE — Assessment & Plan Note (Signed)
Post L shoulder resembled dermatofibroma or scar  Ref to dermatology  occ bothersome

## 2016-12-11 ENCOUNTER — Other Ambulatory Visit: Payer: 59

## 2016-12-18 ENCOUNTER — Encounter: Payer: 59 | Admitting: Family Medicine

## 2016-12-29 DIAGNOSIS — Z1231 Encounter for screening mammogram for malignant neoplasm of breast: Secondary | ICD-10-CM | POA: Diagnosis not present

## 2017-01-01 ENCOUNTER — Encounter: Payer: Self-pay | Admitting: Family Medicine

## 2017-04-04 ENCOUNTER — Telehealth: Payer: Self-pay

## 2017-04-04 NOTE — Telephone Encounter (Signed)
Copied from [place]. Topic: General - Other >> Apr 04, 2017  4:39 PM Ivar Drape wrote: Reason for CRM:   Patient would like a call back from Dr. Glori Bickers about a personal health matter.

## 2017-04-04 NOTE — Telephone Encounter (Signed)
I spoke with pt; pt has an issue; pts mom is here from [place] country; pt trying to get her mom's med [place] without success due to gov't shutdown. pts mom has not seen Dr Glori Bickers before. Pt needs BP med. Co-diovan 160 / 12.5 pt takes one pill at night time.[facility]; pts mom name is Campbell Lerner  DOB 12/03/1948. Advised [facility] may need to take pt to [facility] to get supply of med.Annai hopes Dr Glori Bickers will take care of.Pt request cb.

## 2017-04-04 NOTE — Telephone Encounter (Signed)
Unfortunately if she is not my patient I cannot treat her - she will have to get her est somewhere or go to [facility]

## 2017-04-05 NOTE — Telephone Encounter (Signed)
Left VM letting pt know Dr. Marliss Coots comments regarding her mother

## 2017-06-20 ENCOUNTER — Encounter: Payer: Self-pay | Admitting: Family Medicine

## 2017-06-20 ENCOUNTER — Ambulatory Visit: Payer: 59 | Admitting: Family Medicine

## 2017-06-20 VITALS — BP 112/74 | HR 87 | Temp 98.6°F | Ht 65.5 in | Wt 161.5 lb

## 2017-06-20 DIAGNOSIS — J449 Chronic obstructive pulmonary disease, unspecified: Secondary | ICD-10-CM | POA: Diagnosis not present

## 2017-06-20 DIAGNOSIS — R05 Cough: Secondary | ICD-10-CM | POA: Diagnosis not present

## 2017-06-20 DIAGNOSIS — R058 Other specified cough: Secondary | ICD-10-CM

## 2017-06-20 MED ORDER — HYDROCODONE-HOMATROPINE 5-1.5 MG/5ML PO SYRP: 5.0000 mL | ORAL_SOLUTION | Freq: Two times a day (BID) | ORAL | 0 refills | Status: DC | PRN

## 2017-06-20 MED ORDER — BENZONATATE 200 MG PO CAPS: 200.0000 mg | ORAL_CAPSULE | Freq: Three times a day (TID) | ORAL | 3 refills | Status: DC | PRN

## 2017-06-20 MED ORDER — PREDNISONE 10 MG PO TABS: ORAL_TABLET | ORAL | 0 refills | Status: DC

## 2017-06-20 NOTE — Progress Notes (Signed)
Subjective:    Patient ID: Meredith Farmer, female    DOB: [birth year], 52 y.o.   MRN: [id number]  HPI Here for c/o of cough  Thinks she has the crud Her kids had it   Struggling with cough at night-cannot sleep and unstoppable  Delsym  nyquil  Uses spouse's codiene-no help  Very little production - phlegm is clear  Chest is getting sore/burns and a little tight  No wheezing  No inhalers   May have some spring pollen   At first had a fever and headache and nausea (3 weeks ago)  Other symptoms went away    Wt Readings from Last 3 Encounters:  06/20/17 161 lb 8 oz (73.3 kg)  12/01/16 162 lb (73.5 kg)  02/09/16 162 lb 12 oz (73.8 kg)   26.47 kg/m    Has diag of copd in the past with mild airflow obst (saw pulm) Was on qvar in the past  No wheezing at all   Patient Active Problem List   Diagnosis Date Noted  . Post-viral cough syndrome 06/20/2017  . Skin lesion 12/01/2016  . Family history of carotid artery stenosis 02/09/2016  . Headache 07/10/2014  . Routine general medical examination at a health care facility 12/15/2012  . COPD (chronic obstructive pulmonary disease) ([place]) 11/06/2011  . Abnormal CXR 09/06/2011  . OVARIAN CYST 04/20/2008  . Hyperlipidemia 06/07/2007  . GOITER 06/06/2007  . DEPRESSION 06/06/2007   Past Medical History:  Diagnosis Date  . Depression   . Goiter   . Hyperlipidemia    Past Surgical History:  Procedure Laterality Date  . DILATION AND CURETTAGE OF UTERUS     Social History   Tobacco Use  . Smoking status: Never Smoker  . Smokeless tobacco: Never Used  Substance Use Topics  . Alcohol use: Yes    Alcohol/week: 0.0 oz    Comment: one drink once a week  . Drug use: No   Family History  Problem Relation Age of Onset  . Depression Mother   . Hyperlipidemia Mother   . Alcohol abuse Father   . Alcohol abuse Brother   . Cancer Paternal Aunt        colon  . Cancer Paternal Uncle        colon  . Heart attack  Maternal Grandfather   . Diabetes Maternal Grandmother    Allergies  Allergen Reactions  . Codeine     Headache and upset stomach   Current Outpatient Medications on File Prior to Visit  Medication Sig Dispense Refill  . atorvastatin (LIPITOR) 10 MG tablet Take 1 tablet (10 mg total) by mouth daily. 30 tablet 11  . meloxicam (MOBIC) 15 MG tablet Take 1 tablet (15 mg total) by mouth daily. With food as needed for pain or headache 30 tablet 3   No current facility-administered medications on file prior to visit.     Review of Systems  Constitutional: Negative for activity change, appetite change, fatigue, fever and unexpected weight change.  HENT: Negative for congestion, ear pain, rhinorrhea, sinus pressure and sore throat.   Eyes: Negative for pain, redness and visual disturbance.  Respiratory: Positive for cough. Negative for shortness of breath, wheezing and stridor.   Cardiovascular: Negative for chest pain and palpitations.  Gastrointestinal: Negative for abdominal pain, blood in stool, constipation and diarrhea.  Endocrine: Negative for polydipsia and polyuria.  Genitourinary: Negative for dysuria, frequency and urgency.  Musculoskeletal: Negative for arthralgias, back pain and myalgias.  Skin: Negative  for pallor and rash.  Allergic/Immunologic: Negative for environmental allergies.  Neurological: Negative for dizziness, syncope and headaches.  Hematological: Negative for adenopathy. Does not bruise/bleed easily.  Psychiatric/Behavioral: Negative for decreased concentration and dysphoric mood. The patient is not nervous/anxious.        Objective:   Physical Exam  Constitutional: She appears well-developed and well-nourished. No distress.  Well appearing   HENT:  Head: Normocephalic and atraumatic.  Right Ear: External ear normal.  Left Ear: External ear normal.  Mouth/Throat: Oropharynx is clear and moist. No oropharyngeal exudate.  Nares are boggy Mild clear pnd    Eyes: Pupils are equal, round, and reactive to light. Conjunctivae and EOM are normal. Right eye exhibits no discharge. Left eye exhibits no discharge. No scleral icterus.  Neck: Normal range of motion. Neck supple.  Cardiovascular: Normal rate, regular rhythm and normal heart sounds.  No murmur heard. Pulmonary/Chest: Effort normal and breath sounds normal. No respiratory distress. She has no wheezes. She has no rales. She exhibits no tenderness.  Lymphadenopathy:    She has no cervical adenopathy.  Neurological: She is alert. No cranial nerve deficit.  Skin: Skin is dry. No rash noted. No erythema.  Psychiatric: She has a normal mood and affect.          Assessment & Plan:   Problem List Items Addressed This Visit      Respiratory   Post-viral cough syndrome    S/p uri - cough keeping her up  No help otc meds  Reassuring exam   Avs: You have a post viral cough syndrome   Take the prednisone as directed The tessalon for cough three times daily  The hycodan -as needed with caution of sedation (when not working or driving)   Drink lots of fluids   Update if not starting to improve in a week or if worsening

## 2017-06-20 NOTE — Patient Instructions (Signed)
You have a post viral cough syndrome   Take the prednisone as directed The tessalon for cough three times daily  The hycodan -as needed with caution of sedation (when not working or driving)   Drink lots of fluids   Update if not starting to improve in a week or if worsening

## 2017-06-22 NOTE — Assessment & Plan Note (Addendum)
S/p uri - cough keeping her up  No help otc meds  Reassuring exam   Avs: You have a post viral cough syndrome   Take the prednisone as directed The tessalon for cough three times daily  The hycodan -as needed with caution of sedation (when not working or driving)   Drink lots of fluids   Update if not starting to improve in a week or if worsening

## 2017-06-22 NOTE — Assessment & Plan Note (Signed)
Pulmonary in the past  On qvar in the past-not now  Not active currently

## 2017-07-26 ENCOUNTER — Ambulatory Visit (INDEPENDENT_AMBULATORY_CARE_PROVIDER_SITE_OTHER)
Admission: RE | Admit: 2017-07-26 | Discharge: 2017-07-26 | Disposition: A | Discharge status: Home / Self Care | Payer: 59 | Source: Ambulatory Visit | Attending: Family Medicine | Admitting: Family Medicine

## 2017-07-26 ENCOUNTER — Ambulatory Visit: Payer: 59 | Admitting: Family Medicine

## 2017-07-26 ENCOUNTER — Encounter: Payer: Self-pay | Admitting: Family Medicine

## 2017-07-26 VITALS — BP 116/72 | HR 79 | Temp 98.6°F | Ht 65.5 in | Wt 163.5 lb

## 2017-07-26 DIAGNOSIS — J209 Acute bronchitis, unspecified: Secondary | ICD-10-CM | POA: Insufficient documentation

## 2017-07-26 DIAGNOSIS — R05 Cough: Secondary | ICD-10-CM | POA: Diagnosis not present

## 2017-07-26 MED ORDER — AZITHROMYCIN 250 MG PO TABS: ORAL_TABLET | ORAL | 0 refills | Status: DC

## 2017-07-26 MED ORDER — PREDNISONE 10 MG PO TABS: ORAL_TABLET | ORAL | 0 refills | Status: DC

## 2017-07-26 MED ORDER — BENZONATATE 200 MG PO CAPS: 200.0000 mg | ORAL_CAPSULE | Freq: Three times a day (TID) | ORAL | 3 refills | Status: DC | PRN

## 2017-07-26 NOTE — Patient Instructions (Addendum)
Fluids  Rest  Chest xray now - it should be read by tonight or tomrrow  Rest your voice   Delsym  Or mucinex DM for cough  Prednisone  Tessalon  zithromax for antibiotic   Update if not starting to improve in a week or if worsening    We would consider getting you back to pulmonary if not improving  Keep me posted

## 2017-07-26 NOTE — Assessment & Plan Note (Signed)
Pt has been coughing for 7 weeks-brief respite after last visit  Now new head congestion and laryngitis along with cough Suspect a new viral infection but given length of illness will get cxr and tx aggressively with zpak/prednisone/tessalon Intol of hydrocodone Will continue Dexameth for cough-with expectorant if helpful nsaid for nasal congestion/headache if needed Rest/fluids Disc symptomatic care - see instructions on AVS  Update if not starting to improve in a week or if worsening   Low threshold to get back to pulmonary if no further imp

## 2017-07-26 NOTE — Progress Notes (Signed)
Subjective:    Patient ID: Meredith Farmer, female    DOB: 1966-01-23, 52 y.o.   MRN: [id number]  HPI Here for uri symptoms    Prednisone/hycodan and tessalon- a mo ago for post viral cough syndrome  hydcodan did make her nauseated   She got better for a little while  Then got sick again   Bad nasal congestion  Loosing voice - laryngitis  No wheezing  Nasal d/c -yellow  Phlegm from chest -yellow  No fever  Face is painful and pressure   Taking day quil otc  Does not help much  occ afrin   No longer on qvar    Wt Readings from Last 3 Encounters:  07/26/17 163 lb 8 oz (74.2 kg)  06/20/17 161 lb 8 oz (73.3 kg)  12/01/16 162 lb (73.5 kg)    Patient Active Problem List   Diagnosis Date Noted  . Acute bronchitis 07/26/2017  . Post-viral cough syndrome 06/20/2017  . Skin lesion 12/01/2016  . Family history of carotid artery stenosis 02/09/2016  . Headache 07/10/2014  . Routine general medical examination at a health care facility 12/15/2012  . COPD (chronic obstructive pulmonary disease) ([place]) 11/06/2011  . Abnormal CXR 09/06/2011  . OVARIAN CYST 04/20/2008  . Hyperlipidemia 06/07/2007  . GOITER 06/06/2007  . DEPRESSION 06/06/2007   Past Medical History:  Diagnosis Date  . Depression   . Goiter   . Hyperlipidemia    Past Surgical History:  Procedure Laterality Date  . DILATION AND CURETTAGE OF UTERUS     Social History   Tobacco Use  . Smoking status: Never Smoker  . Smokeless tobacco: Never Used  Substance Use Topics  . Alcohol use: Yes    Alcohol/week: 0.0 oz    Comment: one drink once a week  . Drug use: No   Family History  Problem Relation Age of Onset  . Depression Mother   . Hyperlipidemia Mother   . Alcohol abuse Father   . Alcohol abuse Brother   . Cancer Paternal Aunt        colon  . Cancer Paternal Uncle        colon  . Heart attack Maternal Grandfather   . Diabetes Maternal Grandmother    Allergies  Allergen Reactions    . Codeine     Headache and upset stomach  . Hydrocodone-Homatropine     Nausea    Current Outpatient Medications on File Prior to Visit  Medication Sig Dispense Refill  . atorvastatin (LIPITOR) 10 MG tablet Take 1 tablet (10 mg total) by mouth daily. 30 tablet 11  . meloxicam (MOBIC) 15 MG tablet Take 1 tablet (15 mg total) by mouth daily. With food as needed for pain or headache 30 tablet 3   No current facility-administered medications on file prior to visit.     Review of Systems  Constitutional: Positive for appetite change and fatigue. Negative for fever.  HENT: Positive for congestion, postnasal drip, rhinorrhea, sinus pressure, sneezing, sore throat and voice change. Negative for ear pain.   Eyes: Negative for pain and discharge.  Respiratory: Positive for cough. Negative for shortness of breath, wheezing and stridor.   Cardiovascular: Negative for chest pain.  Gastrointestinal: Negative for diarrhea, nausea and vomiting.  Genitourinary: Negative for frequency, hematuria and urgency.  Musculoskeletal: Negative for arthralgias and myalgias.  Skin: Negative for rash.  Neurological: Positive for headaches. Negative for dizziness, weakness and light-headedness.  Psychiatric/Behavioral: Negative for confusion and dysphoric mood.  Objective:   Physical Exam  Constitutional: She appears well-developed and well-nourished. No distress.  Fatigued   HENT:  Head: Normocephalic and atraumatic.  Right Ear: External ear normal.  Left Ear: External ear normal.  Mouth/Throat: Oropharynx is clear and moist.  Nares are injected and congested  No sinus tenderness Clear rhinorrhea and post nasal drip   Very hoarse voice    Eyes: Pupils are equal, round, and reactive to light. Conjunctivae and EOM are normal. Right eye exhibits no discharge. Left eye exhibits no discharge. No scleral icterus.  Neck: Normal range of motion. Neck supple.  Cardiovascular: Normal rate. Exam reveals  gallop.  Pulmonary/Chest: Effort normal and breath sounds normal. No respiratory distress. She has no wheezes. She has no rales. She exhibits no tenderness.  Harsh bs  No rales Few scattered rhonchi and upper airway sounds No wheeze even on forced exp  Lymphadenopathy:    She has no cervical adenopathy.  Neurological: She is alert.  Skin: Skin is warm and dry. No rash noted.  Psychiatric: She has a normal mood and affect.          Assessment & Plan:   Problem List Items Addressed This Visit      Respiratory   Acute bronchitis - Primary    Pt has been coughing for 7 weeks-brief respite after last visit  Now new head congestion and laryngitis along with cough Suspect a new viral infection but given length of illness will get cxr and tx aggressively with zpak/prednisone/tessalon Intol of hydrocodone Will continue Dexameth for cough-with expectorant if helpful nsaid for nasal congestion/headache if needed Rest/fluids Disc symptomatic care - see instructions on AVS  Update if not starting to improve in a week or if worsening   Low threshold to get back to pulmonary if no further imp       Relevant Orders   DG Chest 2 View

## 2017-08-02 ENCOUNTER — Telehealth: Payer: Self-pay | Admitting: Family Medicine

## 2017-08-02 NOTE — Telephone Encounter (Signed)
Documented in result notes. 

## 2017-08-02 NOTE — Telephone Encounter (Signed)
Copied from [place] [id number]. Topic: Quick Communication - Lab Results >> Aug 02, 2017  1:06 PM Tammi Sou, [place] wrote: Called patient to inform them of Dr. Marliss Coots comments on lab results. When patient returns call, triage nurse may disclose results and see if pt agrees with referral.  Patient returning call.   [phone]

## 2017-11-22 ENCOUNTER — Telehealth: Payer: Self-pay | Admitting: Family Medicine

## 2017-11-22 DIAGNOSIS — Z0000 Encounter for general adult medical examination without abnormal findings: Secondary | ICD-10-CM

## 2017-11-22 DIAGNOSIS — E7800 Pure hypercholesterolemia, unspecified: Secondary | ICD-10-CM

## 2017-11-22 NOTE — Telephone Encounter (Signed)
-----   Message from Lendon Collar, RT sent at 11/19/2017  1:39 PM EDT ----- Regarding: Lab orders for Friday 11/30/17 Please enter CPE lab orders for 11/30/17. Thanks!

## 2017-11-28 ENCOUNTER — Other Ambulatory Visit (INDEPENDENT_AMBULATORY_CARE_PROVIDER_SITE_OTHER): Payer: 59

## 2017-11-28 DIAGNOSIS — Z0000 Encounter for general adult medical examination without abnormal findings: Secondary | ICD-10-CM

## 2017-11-28 DIAGNOSIS — E7800 Pure hypercholesterolemia, unspecified: Secondary | ICD-10-CM | POA: Diagnosis not present

## 2017-11-28 LAB — COMPREHENSIVE METABOLIC PANEL
ALBUMIN: 4.5 g/dL (ref 3.5–5.2)
ALK PHOS: 63 U/L (ref 39–117)
ALT: 9 U/L (ref 0–35)
AST: 14 U/L (ref 0–37)
BILIRUBIN TOTAL: 0.4 mg/dL (ref 0.2–1.2)
BUN: 9 mg/dL (ref 6–23)
CALCIUM: 10.2 mg/dL (ref 8.4–10.5)
CO2: 32 meq/L (ref 19–32)
Chloride: 103 mEq/L (ref 96–112)
Creatinine, Ser: 0.83 mg/dL (ref 0.40–1.20)
GFR: 76.59 mL/min (ref >60.00)
Glucose, Bld: 71 mg/dL (ref 70–99)
Potassium: 4.4 mEq/L (ref 3.5–5.1)
Sodium: 140 mEq/L (ref 135–145)
TOTAL PROTEIN: 7.3 g/dL (ref 6.0–8.3)

## 2017-11-28 LAB — LIPID PANEL
Cholesterol: 284 mg/dL — ABNORMAL HIGH (ref 0–200)
HDL: 100 mg/dL (ref >39.00)
LDL Cholesterol: 170 mg/dL — ABNORMAL HIGH (ref 0–99)
NonHDL: 184.03
TRIGLYCERIDES: 72 mg/dL (ref 0.0–149.0)
Total CHOL/HDL Ratio: 3
VLDL: 14.4 mg/dL (ref 0.0–40.0)

## 2017-11-28 LAB — CBC WITH DIFFERENTIAL/PLATELET
Basophils Absolute: 0 10*3/uL (ref 0.0–0.1)
Basophils Relative: 0.6 % (ref 0.0–3.0)
EOS ABS: 0 10*3/uL (ref 0.0–0.7)
Eosinophils Relative: 0.9 % (ref 0.0–5.0)
HCT: 41.3 % (ref 36.0–46.0)
HEMOGLOBIN: 14.1 g/dL (ref 12.0–15.0)
LYMPHS ABS: 1.9 10*3/uL (ref 0.7–4.0)
LYMPHS PCT: 37 % (ref 12.0–46.0)
MCHC: 34.1 g/dL (ref 30.0–36.0)
MCV: 90.3 fl (ref 78.0–100.0)
MONO ABS: 0.5 10*3/uL (ref 0.1–1.0)
Monocytes Relative: 9.4 % (ref 3.0–12.0)
Neutro Abs: 2.6 10*3/uL (ref 1.4–7.7)
Neutrophils Relative %: 52.1 % (ref 43.0–77.0)
Platelets: 274 10*3/uL (ref 150.0–400.0)
RBC: 4.58 Mil/uL (ref 3.87–5.11)
RDW: 12.8 % (ref 11.5–15.5)
WBC: 5.1 10*3/uL (ref 4.0–10.5)

## 2017-11-28 LAB — TSH: TSH: 1.09 u[IU]/mL (ref 0.35–4.50)

## 2017-11-30 ENCOUNTER — Other Ambulatory Visit: Payer: 59

## 2017-12-03 ENCOUNTER — Ambulatory Visit (INDEPENDENT_AMBULATORY_CARE_PROVIDER_SITE_OTHER): Payer: 59 | Admitting: Family Medicine

## 2017-12-03 ENCOUNTER — Encounter: Payer: Self-pay | Admitting: Family Medicine

## 2017-12-03 VITALS — BP 110/72 | HR 77 | Temp 98.6°F | Ht 65.0 in | Wt 163.0 lb

## 2017-12-03 DIAGNOSIS — Z0000 Encounter for general adult medical examination without abnormal findings: Secondary | ICD-10-CM

## 2017-12-03 DIAGNOSIS — E7800 Pure hypercholesterolemia, unspecified: Secondary | ICD-10-CM | POA: Diagnosis not present

## 2017-12-03 MED ORDER — ROSUVASTATIN CALCIUM 5 MG PO TABS: 5.0000 mg | ORAL_TABLET | Freq: Every day | ORAL | 11 refills | Status: DC

## 2017-12-03 NOTE — Progress Notes (Signed)
Subjective:    Patient ID: Meredith Farmer, female    DOB: [birth year], 52 y.o.   MRN: [id number]  HPI  Here for health maintenance exam and to review chronic medical problems    Working a lot  Things are going ok  Feeling ok  Not coughing   Wt Readings from Last 3 Encounters:  12/03/17 163 lb (73.9 kg)  07/26/17 163 lb 8 oz (74.2 kg)  06/20/17 161 lb 8 oz (73.3 kg)  stable weight  No time for exercise (would like to do some walking)  27.12 kg/m    Pap 3/16 Needs to go to gyn   Flu shot-will get at work-in next few weeks   Mammogram 10/18 - goes to [facility] / will schedule  Self breast exam - no lumps   Colonoscopy 3/14 nl with 10 y recall   Tetanus shot 11/17  Zoster status -interested in shingrix   BP BP Readings from Last 3 Encounters:  12/03/17 110/72  07/26/17 116/72  06/20/17 112/74   Pulse Readings from Last 3 Encounters:  12/03/17 77  07/26/17 79  06/20/17 87    Hyperlipidemia Lab Results  Component Value Date   CHOL 284 (H) 11/28/2017   CHOL 203 (H) 11/24/2016   CHOL 182 03/24/2016   Lab Results  Component Value Date   HDL 100.00 11/28/2017   HDL 109.20 11/24/2016   HDL 87.50 03/24/2016   Lab Results  Component Value Date   LDLCALC 170 (H) 11/28/2017   LDLCALC 77 11/24/2016   LDLCALC 82 03/24/2016   Lab Results  Component Value Date   TRIG 72.0 11/28/2017   TRIG 83.0 11/24/2016   TRIG 62.0 03/24/2016   Lab Results  Component Value Date   CHOLHDL 3 11/28/2017   CHOLHDL 2 11/24/2016   CHOLHDL 2 03/24/2016   Lab Results  Component Value Date   LDLDIRECT 135.9 12/16/2012   LDLDIRECT 143.4 01/23/2012   LDLDIRECT 148.5 02/24/2011   Not taking her lipitor  Thinks it was causing her pain in chest wall - went away when she stopped it   Other labs  Results for orders placed or performed in visit on 11/28/17  TSH  Result Value Ref Range   TSH 1.09 0.35 - 4.50 uIU/mL  Lipid panel  Result Value Ref Range   Cholesterol 284  (H) 0 - 200 mg/dL   Triglycerides 72.0 0.0 - 149.0 mg/dL   HDL 100.00 >39.00 mg/dL   VLDL 14.4 0.0 - 40.0 mg/dL   LDL Cholesterol 170 (H) 0 - 99 mg/dL   Total CHOL/HDL Ratio 3    NonHDL 184.03   Comprehensive metabolic panel  Result Value Ref Range   Sodium 140 135 - 145 mEq/L   Potassium 4.4 3.5 - 5.1 mEq/L   Chloride 103 96 - 112 mEq/L   CO2 32 19 - 32 mEq/L   Glucose, Bld 71 70 - 99 mg/dL   BUN 9 6 - 23 mg/dL   Creatinine, Ser 0.83 0.40 - 1.20 mg/dL   Total Bilirubin 0.4 0.2 - 1.2 mg/dL   Alkaline Phosphatase 63 39 - 117 U/L   AST 14 0 - 37 U/L   ALT 9 0 - 35 U/L   Total Protein 7.3 6.0 - 8.3 g/dL   Albumin 4.5 3.5 - 5.2 g/dL   Calcium 10.2 8.4 - 10.5 mg/dL   GFR 76.59 >60.00 mL/min  CBC with Differential/Platelet  Result Value Ref Range   WBC 5.1 4.0 - 10.5 K/uL  RBC 4.58 3.87 - 5.11 Mil/uL   Hemoglobin 14.1 12.0 - 15.0 g/dL   HCT 41.3 36.0 - 46.0 %   MCV 90.3 78.0 - 100.0 fl   MCHC 34.1 30.0 - 36.0 g/dL   RDW 12.8 11.5 - 15.5 %   Platelets 274.0 150.0 - 400.0 K/uL   Neutrophils Relative % 52.1 43.0 - 77.0 %   Lymphocytes Relative 37.0 12.0 - 46.0 %   Monocytes Relative 9.4 3.0 - 12.0 %   Eosinophils Relative 0.9 0.0 - 5.0 %   Basophils Relative 0.6 0.0 - 3.0 %   Neutro Abs 2.6 1.4 - 7.7 K/uL   Lymphs Abs 1.9 0.7 - 4.0 K/uL   Monocytes Absolute 0.5 0.1 - 1.0 K/uL   Eosinophils Absolute 0.0 0.0 - 0.7 K/uL   Basophils Absolute 0.0 0.0 - 0.1 K/uL          Review of Systems  Constitutional: Negative for activity change, appetite change, fatigue, fever and unexpected weight change.  HENT: Negative for congestion, ear pain, rhinorrhea, sinus pressure and sore throat.   Eyes: Negative for pain, redness and visual disturbance.  Respiratory: Negative for cough, shortness of breath and wheezing.   Cardiovascular: Negative for chest pain and palpitations.  Gastrointestinal: Negative for abdominal pain, blood in stool, constipation and diarrhea.  Endocrine: Negative  for polydipsia and polyuria.  Genitourinary: Negative for dysuria, frequency and urgency.  Musculoskeletal: Negative for arthralgias, back pain and myalgias.  Skin: Negative for pallor and rash.  Allergic/Immunologic: Negative for environmental allergies.  Neurological: Negative for dizziness, syncope and headaches.  Hematological: Negative for adenopathy. Does not bruise/bleed easily.  Psychiatric/Behavioral: Negative for decreased concentration and dysphoric mood. The patient is not nervous/anxious.        Objective:   Physical Exam  Constitutional: She appears well-developed and well-nourished. No distress.  Well appearing   HENT:  Head: Normocephalic and atraumatic.  Right Ear: External ear normal.  Left Ear: External ear normal.  Mouth/Throat: Oropharynx is clear and moist.  Eyes: Pupils are equal, round, and reactive to light. Conjunctivae and EOM are normal. Right eye exhibits no discharge. Left eye exhibits no discharge. No scleral icterus.  Neck: Normal range of motion. Neck supple. No JVD present. Carotid bruit is not present. No thyromegaly present.  Cardiovascular: Normal rate, regular rhythm, normal heart sounds and intact distal pulses. Exam reveals no gallop.  Pulmonary/Chest: Effort normal and breath sounds normal. No respiratory distress. She has no wheezes. She exhibits no tenderness. No breast tenderness, discharge or bleeding.  Abdominal: Soft. Bowel sounds are normal. She exhibits no distension, no abdominal bruit and no mass. There is no tenderness.  Genitourinary: No breast tenderness, discharge or bleeding.  Genitourinary Comments: Breast exam: No mass, nodules, thickening, tenderness, bulging, retraction, inflamation, nipple discharge or skin changes noted.  No axillary or clavicular LA.      Musculoskeletal: Normal range of motion. She exhibits no edema or tenderness.  Lymphadenopathy:    She has no cervical adenopathy.  Neurological: She is alert. She has  normal reflexes. She displays normal reflexes. No cranial nerve deficit. She exhibits normal muscle tone. Coordination normal.  Skin: Skin is warm and dry. No rash noted. No erythema. No pallor.  Solar lentigines diffusely   Psychiatric: She has a normal mood and affect.  Pleasant with good mood          Assessment & Plan:   Problem List Items Addressed This Visit      Other  Hyperlipidemia    Intolerant of atorvastatin (caused chest wall pain) Will try low dose crestor 5 mg daily  Adv to hold and alert for side eff  LDL is up to 172 w/o medication  Update if not starting to improve in a week or if worsening   Plan for re check on statin       Relevant Medications   rosuvastatin (CRESTOR) 5 MG tablet   Routine general medical examination at a health care facility - Primary    Reviewed health habits including diet and exercise and skin cancer prevention Reviewed appropriate screening tests for age  Also reviewed health mt list, fam hx and immunization status , as well as social and family history   See HPI Labs rev  Will get flu shot at work  Will schedule her own mammogram  Plans to try another statin  Disc shingrix vaccine  Aim for making time for self care

## 2017-12-03 NOTE — Assessment & Plan Note (Signed)
Reviewed health habits including diet and exercise and skin cancer prevention Reviewed appropriate screening tests for age  Also reviewed health mt list, fam hx and immunization status , as well as social and family history   See HPI Labs rev  Will get flu shot at work  Will schedule her own mammogram  Plans to try another statin  Disc shingrix vaccine  Aim for making time for self care

## 2017-12-03 NOTE — Assessment & Plan Note (Signed)
Intolerant of atorvastatin (caused chest wall pain) Will try low dose crestor 5 mg daily  Adv to hold and alert for side eff  LDL is up to 172 w/o medication  Update if not starting to improve in a week or if worsening   Plan for re check on statin

## 2017-12-03 NOTE — Patient Instructions (Addendum)
You are due for pap- make appt with your gyn for visit (have them send [place] your report)   Don't forget to schedule your mammogram at [facility] for October   If you are interested in the new shingles vaccine (Shingrix) - call your local pharmacy to check on coverage and availability  If affordable - get on a waiting list at the pharmacy  Take care of yourself  Fit in exercise when you can   Get your flu shot at work   Let's check cholesterol on crestor  In 6-8 weeks If side effects -stop it and alert [place]

## 2017-12-05 DIAGNOSIS — Z23 Encounter for immunization: Secondary | ICD-10-CM | POA: Diagnosis not present

## 2017-12-16 ENCOUNTER — Encounter: Payer: Self-pay | Admitting: Internal Medicine

## 2017-12-16 ENCOUNTER — Emergency Department: Payer: 59

## 2017-12-16 ENCOUNTER — Other Ambulatory Visit: Payer: Self-pay

## 2017-12-16 ENCOUNTER — Emergency Department
Admission: EM | Admit: 2017-12-16 | Discharge: 2017-12-16 | Disposition: A | Discharge status: Home / Self Care | Payer: 59 | Attending: Emergency Medicine | Admitting: Emergency Medicine

## 2017-12-16 DIAGNOSIS — Y939 Activity, unspecified: Secondary | ICD-10-CM | POA: Diagnosis not present

## 2017-12-16 DIAGNOSIS — J449 Chronic obstructive pulmonary disease, unspecified: Secondary | ICD-10-CM | POA: Insufficient documentation

## 2017-12-16 DIAGNOSIS — W010XXA Fall on same level from slipping, tripping and stumbling without subsequent striking against object, initial encounter: Secondary | ICD-10-CM | POA: Insufficient documentation

## 2017-12-16 DIAGNOSIS — S91112A Laceration without foreign body of left great toe without damage to nail, initial encounter: Secondary | ICD-10-CM | POA: Insufficient documentation

## 2017-12-16 DIAGNOSIS — Y92009 Unspecified place in unspecified non-institutional (private) residence as the place of occurrence of the external cause: Secondary | ICD-10-CM | POA: Diagnosis not present

## 2017-12-16 DIAGNOSIS — Z79899 Other long term (current) drug therapy: Secondary | ICD-10-CM | POA: Diagnosis not present

## 2017-12-16 DIAGNOSIS — Y999 Unspecified external cause status: Secondary | ICD-10-CM | POA: Diagnosis not present

## 2017-12-16 MED ORDER — BACITRACIN-NEOMYCIN-POLYMYXIN 400-5-5000 EX OINT
TOPICAL_OINTMENT | Freq: Once | CUTANEOUS | Status: AC
  Administered 2017-12-16: 23:00:00 via TOPICAL
  Filled 2017-12-16: qty 1

## 2017-12-16 MED ORDER — LIDOCAINE HCL (PF) 1 % IJ SOLN
5.0000 mL | Freq: Once | INTRAMUSCULAR | Status: AC
  Administered 2017-12-16: 5 mL
  Filled 2017-12-16: qty 5

## 2017-12-16 NOTE — Discharge Instructions (Addendum)
You have been diagnosed with a laceration to your left great toe.  The x-ray of your toe was negative for any bony abnormality.  We placed 6 sutures  to close the laceration.  You can apply Neosporin twice a day until you have the sutures removed.  Follow-up with your PCP in 1 week for suture removal.

## 2017-12-16 NOTE — ED Notes (Signed)
Wound dressed. ?

## 2017-12-16 NOTE — ED Triage Notes (Signed)
Pt tripped on a wire and lacerated her left great toe.

## 2017-12-16 NOTE — ED Provider Notes (Signed)
[facility] Emergency Department Provider Note ____________________________________________  Time seen: 2135  I have reviewed the triage vital signs and the nursing notes.  HISTORY  Chief Complaint  Laceration   HPI Meredith Farmer is a 52 y.o. female presents to the ER today with complaint of a laceration to her left great toe.  She reports she tripped over a computer wire.  She was able to control the bleeding at home.  She does report pain to the left great toe with plantar flexion.  She did not take anything prior to arrival.  Tdap given 01/2016.  Past Medical History:  Diagnosis Date  . Depression   . Goiter   . Hyperlipidemia     Patient Active Problem List   Diagnosis Date Noted  . Family history of carotid artery stenosis 02/09/2016  . Headache 07/10/2014  . Routine general medical examination at a health care facility 12/15/2012  . COPD (chronic obstructive pulmonary disease) ([place]) 11/06/2011  . Abnormal CXR 09/06/2011  . Hyperlipidemia 06/07/2007  . GOITER 06/06/2007    Past Surgical History:  Procedure Laterality Date  . DILATION AND CURETTAGE OF UTERUS      Prior to Admission medications   Medication Sig Start Date End Date Taking? Authorizing Provider  meloxicam (MOBIC) 15 MG tablet Take 1 tablet (15 mg total) by mouth daily. With food as needed for pain or headache 12/01/16   Tower, Wynelle Fanny, MD  rosuvastatin (CRESTOR) 5 MG tablet Take 1 tablet (5 mg total) by mouth daily. 12/03/17   Tower, Wynelle Fanny, MD    Allergies Atorvastatin; Codeine; and Hydrocodone-homatropine  Family History  Problem Relation Age of Onset  . Depression Mother   . Hyperlipidemia Mother   . Alcohol abuse Father   . Alcohol abuse Brother   . Cancer Paternal Aunt        colon  . Cancer Paternal Uncle        colon  . Heart attack Maternal Grandfather   . Diabetes Maternal Grandmother     Social History Social History   Tobacco Use  . Smoking  status: Never Smoker  . Smokeless tobacco: Never Used  Substance Use Topics  . Alcohol use: Yes    Alcohol/week: 0.0 standard drinks    Comment: one drink once a week  . Drug use: No    Review of Systems  Constitutional: Negative for fever. Musculoskeletal: Positive for toe pain. Skin: As to for laceration to left great toe. Neurological: Negative for focal weakness or numbness. ____________________________________________  PHYSICAL EXAM:  VITAL SIGNS: ED Triage Vitals [12/16/17 2125]  Enc Vitals Group     BP 125/72     Pulse Rate 81     Resp 20     Temp (!) 97.5 F (36.4 C)     Temp Source Oral     SpO2 100 %     Weight 163 lb (73.9 kg)     Height 5\' 5"  (1.651 m)     Head Circumference      Peak Flow      Pain Score 5     Pain Loc      Pain Edu?      Excl. in [place]?     Constitutional: Alert and oriented. Well appearing and in no distress. Musculoskeletal: Normal active plantar flexion and dorsiflexion.  Normal strength with resistance to plantar flexion. Neurologic: Station intact to LLE. Skin:  2 cm laceration over distal end of proximal phalanx, left great  toe.  ____________________________________________   RADIOLOGY   Imaging Orders     DG Toe Great Left IMPRESSION: 1. Dorsal soft tissue swelling.  No acute bony findings. ____________________________________________  PROCEDURES  .Marland KitchenLaceration Repair Date/Time: 12/16/2017 10:29 PM Performed by: Jearld Fenton, NP Authorized by: Jearld Fenton, NP   Consent:    Consent obtained:  Verbal   Consent given by:  Patient   Risks discussed:  Infection, pain and poor cosmetic result   Alternatives discussed:  No treatment Anesthesia (see MAR for exact dosages):    Anesthesia method:  Local infiltration   Local anesthetic:  Lidocaine 1% w/o epi Laceration details:    Location:  Toe   Toe location:  L big toe   Length (cm):  2 Repair type:    Repair type:  Simple Pre-procedure details:     Preparation:  Patient was prepped and draped in usual sterile fashion Exploration:    Hemostasis achieved with:  Direct pressure   Wound exploration: wound explored through full range of motion and entire depth of wound probed and visualized     Contaminated: no   Treatment:    Area cleansed with:  Betadine   Amount of cleaning:  Standard   Irrigation solution:  Sterile saline   Irrigation method:  Syringe   Visualized foreign bodies/material removed: no   Skin repair:    Repair method:  Sutures   Suture size:  4-0   Suture material:  Prolene   Suture technique:  Simple interrupted   Number of sutures:  6 Approximation:    Approximation:  Close Post-procedure details:    Dressing:  Antibiotic ointment   Patient tolerance of procedure:  Tolerated well, no immediate complications    ____________________________________________  INITIAL IMPRESSION / ASSESSMENT AND PLAN / ED COURSE  Laceration of Left Great Toe:  Xray negative Sutured- see procedure note Aftercare instructions provided ____________________________________________  FINAL CLINICAL IMPRESSION(S) / ED DIAGNOSES  Final diagnoses:  Laceration of left great toe without foreign body present or damage to nail, initial encounter      Jearld Fenton, NP 12/16/17 2233    Nena Polio, MD 12/17/17 [phone]

## 2017-12-24 ENCOUNTER — Ambulatory Visit: Payer: 59 | Admitting: Family Medicine

## 2017-12-24 ENCOUNTER — Encounter: Payer: Self-pay | Admitting: Family Medicine

## 2017-12-24 VITALS — BP 112/68 | HR 60 | Temp 98.2°F | Ht 65.0 in | Wt 162.5 lb

## 2017-12-24 DIAGNOSIS — S91112D Laceration without foreign body of left great toe without damage to nail, subsequent encounter: Secondary | ICD-10-CM | POA: Diagnosis not present

## 2017-12-24 DIAGNOSIS — S91112A Laceration without foreign body of left great toe without damage to nail, initial encounter: Secondary | ICD-10-CM | POA: Insufficient documentation

## 2017-12-24 NOTE — Patient Instructions (Signed)
Wound looks good  Keep clean with soap and water  Cover when needed   If red/swollen or excessive drainage let us know Antibiotic ointment is ok

## 2017-12-24 NOTE — Progress Notes (Signed)
Subjective:    Patient ID: Meredith Farmer, female    DOB: 05-20-1965, 52 y.o.   MRN: [id number]  HPI Here for suture removal from ED Seen 10/6 for laceration of L great toe after tripping on computer wire  The wire caught and cut it  Imaging was neg 6 simple int. Sutures  It is still a little sore esp in am  Not itchy or drainage Wound looks good   Patient Active Problem List   Diagnosis Date Noted  . Laceration of left great toe 12/24/2017  . Family history of carotid artery stenosis 02/09/2016  . Headache 07/10/2014  . Routine general medical examination at a health care facility 12/15/2012  . COPD (chronic obstructive pulmonary disease) ([place]) 11/06/2011  . Abnormal CXR 09/06/2011  . Hyperlipidemia 06/07/2007  . GOITER 06/06/2007   Past Medical History:  Diagnosis Date  . Depression   . Goiter   . Hyperlipidemia    Past Surgical History:  Procedure Laterality Date  . DILATION AND CURETTAGE OF UTERUS     Social History   Tobacco Use  . Smoking status: Never Smoker  . Smokeless tobacco: Never Used  Substance Use Topics  . Alcohol use: Yes    Alcohol/week: 0.0 standard drinks    Comment: one drink once a week  . Drug use: No   Family History  Problem Relation Age of Onset  . Depression Mother   . Hyperlipidemia Mother   . Alcohol abuse Father   . Alcohol abuse Brother   . Cancer Paternal Aunt        colon  . Cancer Paternal Uncle        colon  . Heart attack Maternal Grandfather   . Diabetes Maternal Grandmother    Allergies  Allergen Reactions  . Atorvastatin     Muscle pain  . Codeine     Headache and upset stomach  . Hydrocodone-Homatropine     Nausea    Current Outpatient Medications on File Prior to Visit  Medication Sig Dispense Refill  . meloxicam (MOBIC) 15 MG tablet Take 1 tablet (15 mg total) by mouth daily. With food as needed for pain or headache 30 tablet 3  . rosuvastatin (CRESTOR) 5 MG tablet Take 1 tablet (5 mg total) by  mouth daily. 30 tablet 11   No current facility-administered medications on file prior to visit.     Review of Systems  Constitutional: Negative for chills, fatigue and fever.  HENT: Negative for sore throat.   Cardiovascular: Negative for chest pain.  Musculoskeletal: Negative for arthralgias.  Skin: Positive for wound.       Wound on L toe is sore but less than it was No redness  Scant drainage if any  Hematological: Does not bruise/bleed easily.       Objective:   Physical Exam  Constitutional: She appears well-developed and well-nourished. No distress.  Well appearing   HENT:  Head: Normocephalic and atraumatic.  Eyes: Pupils are equal, round, and reactive to light. Conjunctivae and EOM are normal. Right eye exhibits no discharge. Left eye exhibits no discharge.  Cardiovascular: Normal rate and regular rhythm.  Neurological: She is alert. No sensory deficit.  Skin: Skin is warm and dry. No rash noted. No erythema.  Linear horizontal laceration on L great toe with 6 simple interrupted sutures  No redness/swelling or drainage  slt scab formation   Sutures removed/ tol well  Skin edges are well approximated  Steri strips applied  Psychiatric: She has a normal mood and affect.          Assessment & Plan:   Problem List Items Addressed This Visit      Other   Laceration of left great toe - Primary    Well healed 6 sutures removed today  Steri strips applied /pt tol well  Disc after care Soap and water cleanse/abx oint prn Protect from dirt/etc   Update if it does not continue  to improve in a week or if worsening  (ie: redness/swelling or drainage)

## 2017-12-24 NOTE — Assessment & Plan Note (Signed)
Well healed 6 sutures removed today  Steri strips applied /pt tol well  Disc after care Soap and water cleanse/abx oint prn Protect from dirt/etc   Update if it does not continue  to improve in a week or if worsening  (ie: redness/swelling or drainage)

## 2018-01-01 DIAGNOSIS — Z1231 Encounter for screening mammogram for malignant neoplasm of breast: Secondary | ICD-10-CM | POA: Diagnosis not present

## 2018-01-03 ENCOUNTER — Encounter: Payer: Self-pay | Admitting: Family Medicine

## 2018-02-05 ENCOUNTER — Telehealth: Payer: Self-pay

## 2018-02-05 MED ORDER — ZOSTER VAC RECOMB ADJUVANTED 50 MCG/0.5ML IM SUSR: 0.5000 mL | Freq: Once | INTRAMUSCULAR | 1 refills | Status: AC

## 2018-02-05 NOTE — Telephone Encounter (Signed)
Pt notified Rx sent 

## 2018-02-05 NOTE — Telephone Encounter (Signed)
Pt last annual 12/03/17; pt requesting shingrix order to [facility] [place]. Pt request cb when rx has been sent to pharmacy.

## 2018-02-05 NOTE — Telephone Encounter (Signed)
Done

## 2018-03-04 DIAGNOSIS — Z6826 Body mass index (BMI) 26.0-26.9, adult: Secondary | ICD-10-CM | POA: Diagnosis not present

## 2018-03-04 DIAGNOSIS — Z01419 Encounter for gynecological examination (general) (routine) without abnormal findings: Secondary | ICD-10-CM | POA: Diagnosis not present

## 2018-11-07 ENCOUNTER — Other Ambulatory Visit: Payer: Self-pay | Admitting: *Deleted

## 2018-11-07 MED ORDER — ROSUVASTATIN CALCIUM 5 MG PO TABS: 5.0000 mg | ORAL_TABLET | Freq: Every day | ORAL | 1 refills | Status: DC

## 2018-11-16 ENCOUNTER — Telehealth: Payer: Self-pay

## 2018-11-16 NOTE — Telephone Encounter (Signed)
I do not know any 24 hour turn around  for asymptomatic testing.   She could try calling the health department or one of the testing sites. Sorry.

## 2018-11-16 NOTE — Telephone Encounter (Signed)
Patient called back about this. She states she needs COVID test done and have results back in 24 hours. Where can she go? She has to get a medical VISA in order to enter [place] and that has to be done within 78 hours. Her father is very sick (patient was tearful on the phone).

## 2018-11-16 NOTE — Telephone Encounter (Signed)
Received message from [facility] stating that patient needs a COVID test because she needs to go to [place] to see her father who is ill. She has not symptoms but needs to be tested before getting on the plane. Per message she needs to fly out Monday. CB: [phone]

## 2018-11-16 NOTE — Telephone Encounter (Signed)
Patient advised. I called several places to find out. I was able to get phone number for COVID hotline and get some locations that patient can check with on urgent testing. Patient understood and will most likely have to wait till Monday and go to urgent care. Advised patient to let us know if we can try and help with anything else.

## 2018-11-19 NOTE — Telephone Encounter (Signed)
[facility] RECORD [facility] Patient Name: Meredith Farmer ON Gender: Female DOB: 04/30/1965 Age: 53 Y 35 M 28 D Return Phone Number: [phone] (Primary) Address: City/State/Zip: Meredith Farmer [place] [address] Client [facility] Night - Client Client Site [facility], Roque Lias - MD Contact Type Call Who Is Calling Patient / Member / Family / Caregiver Call Type Triage / Clinical Relationship To Patient Self Return Phone Number [phone] (Primary) Chief Complaint Health information question (non symptomatic) Reason for Call Symptomatic / Request for [place] states she needs a COVID test. She needs to get on a plane to travel to the [place] to see her father who is ill. She has no symptoms. Translation No Nurse Assessment Nurse: Aris Lot, RN, Christina Date/Time (Eastern Time): 11/15/2018 6:11:42 PM Confirm and document reason for call. If symptomatic, describe symptoms. ---Caller stated she is needing to be tested for COVID 19 because she needs to fly to the [place] for her father because he is sick and she needs to fly out Monday morning Has the patient had close contact with a person known or suspected to have the novel coronavirus illness OR traveled / lives in area with major community spread (including international travel) in the last 14 days from the onset of symptoms? * If Asymptomatic, screen for exposure and travel within the last 14 days. ---No Does the patient have any new or worsening symptoms? ---No Please document clinical information provided and list any resource used. ---Informed patient to call tomorrow for Tele med visit to discuss testing with MD Guidelines Guideline Title Affirmed Question Affirmed Notes Nurse Date/Time ([place] Time) Disp. Time Meredith Farmer Time) Disposition Final User 11/15/2018 6:17:12 PM  Clinical Call Yes Aris Lot, RN, Margreta Journey

## 2018-11-19 NOTE — Telephone Encounter (Signed)
Sounds like she most likely already left- but if she is still here and needs covid testing please give her address   Thanks

## 2018-11-19 NOTE — Telephone Encounter (Signed)
Pt needed 24 hr test and our testing site doesn't do that. See prev note but also pt left on Monday

## 2018-11-28 ENCOUNTER — Telehealth: Payer: Self-pay

## 2018-11-28 NOTE — Telephone Encounter (Signed)
Left detailed VM w COVID screen and back door lab info and front door info   

## 2018-12-01 ENCOUNTER — Telehealth: Payer: Self-pay | Admitting: Family Medicine

## 2018-12-01 DIAGNOSIS — E7800 Pure hypercholesterolemia, unspecified: Secondary | ICD-10-CM

## 2018-12-01 DIAGNOSIS — Z0000 Encounter for general adult medical examination without abnormal findings: Secondary | ICD-10-CM

## 2018-12-01 NOTE — Telephone Encounter (Signed)
-----   Message from Ellamae Sia sent at 11/26/2018  9:28 AM EDT ----- Regarding: Lab orders for Monday 9.21.20 Patient is scheduled for CPX labs, please order future labs, Thanks , Karna Christmas

## 2018-12-02 ENCOUNTER — Other Ambulatory Visit: Payer: Self-pay

## 2018-12-03 ENCOUNTER — Other Ambulatory Visit: Payer: 59

## 2018-12-06 ENCOUNTER — Encounter: Payer: 59 | Admitting: Family Medicine

## 2019-01-16 ENCOUNTER — Other Ambulatory Visit: Payer: Self-pay

## 2019-01-24 ENCOUNTER — Encounter: Payer: Self-pay | Admitting: Family Medicine

## 2019-02-12 ENCOUNTER — Other Ambulatory Visit: Payer: Self-pay

## 2019-02-12 ENCOUNTER — Other Ambulatory Visit (INDEPENDENT_AMBULATORY_CARE_PROVIDER_SITE_OTHER): Payer: 59

## 2019-02-12 ENCOUNTER — Other Ambulatory Visit: Payer: 59

## 2019-02-12 ENCOUNTER — Telehealth: Payer: Self-pay

## 2019-02-12 DIAGNOSIS — E7800 Pure hypercholesterolemia, unspecified: Secondary | ICD-10-CM | POA: Diagnosis not present

## 2019-02-12 DIAGNOSIS — Z0000 Encounter for general adult medical examination without abnormal findings: Secondary | ICD-10-CM | POA: Diagnosis not present

## 2019-02-12 LAB — CBC WITH DIFFERENTIAL/PLATELET
Basophils Absolute: 0 10*3/uL (ref 0.0–0.1)
Basophils Relative: 0.1 % (ref 0.0–3.0)
Eosinophils Absolute: 0.1 10*3/uL (ref 0.0–0.7)
Eosinophils Relative: 0.9 % (ref 0.0–5.0)
HCT: 38.3 % (ref 36.0–46.0)
Hemoglobin: 12.9 g/dL (ref 12.0–15.0)
Lymphocytes Relative: 27.1 % (ref 12.0–46.0)
Lymphs Abs: 1.6 10*3/uL (ref 0.7–4.0)
MCHC: 33.8 g/dL (ref 30.0–36.0)
MCV: 91.3 fl (ref 78.0–100.0)
Monocytes Absolute: 0.5 10*3/uL (ref 0.1–1.0)
Monocytes Relative: 8.6 % (ref 3.0–12.0)
Neutro Abs: 3.7 10*3/uL (ref 1.4–7.7)
Neutrophils Relative %: 63.3 % (ref 43.0–77.0)
Platelets: 227 10*3/uL (ref 150.0–400.0)
RBC: 4.19 Mil/uL (ref 3.87–5.11)
RDW: 13.3 % (ref 11.5–15.5)
WBC: 5.8 10*3/uL (ref 4.0–10.5)

## 2019-02-12 LAB — COMPREHENSIVE METABOLIC PANEL
ALT: 11 U/L (ref 0–35)
AST: 14 U/L (ref 0–37)
Albumin: 4.3 g/dL (ref 3.5–5.2)
Alkaline Phosphatase: 56 U/L (ref 39–117)
BUN: 17 mg/dL (ref 6–23)
CO2: 29 mEq/L (ref 19–32)
Calcium: 9.5 mg/dL (ref 8.4–10.5)
Chloride: 105 mEq/L (ref 96–112)
Creatinine, Ser: 0.7 mg/dL (ref 0.40–1.20)
GFR: 87.31 mL/min (ref >60.00)
Glucose, Bld: 84 mg/dL (ref 70–99)
Potassium: 4.3 mEq/L (ref 3.5–5.1)
Sodium: 140 mEq/L (ref 135–145)
Total Bilirubin: 0.4 mg/dL (ref 0.2–1.2)
Total Protein: 6.6 g/dL (ref 6.0–8.3)

## 2019-02-12 LAB — LIPID PANEL
Cholesterol: 237 mg/dL — ABNORMAL HIGH (ref 0–200)
HDL: 88.8 mg/dL (ref >39.00)
LDL Cholesterol: 136 mg/dL — ABNORMAL HIGH (ref 0–99)
NonHDL: 148.08
Total CHOL/HDL Ratio: 3
Triglycerides: 61 mg/dL (ref 0.0–149.0)
VLDL: 12.2 mg/dL (ref 0.0–40.0)

## 2019-02-12 LAB — TSH: TSH: 1.21 u[IU]/mL (ref 0.35–4.50)

## 2019-02-12 NOTE — Telephone Encounter (Signed)
[facility] Night - Client Nonclinical Telephone Record [facility] Client [facility] Night - Client Client Site [place] - Night Physician [facility] - PHYSICIAN, Verita Schneiders- MD Contact Type Call Who Is Calling Patient / Member / Family / Caregiver Caller Name [place] Phone Number [phone] Patient Name Meredith Farmer Patient DOB 03/11/1966 Call Type Message Only Information Provided Reason for Call Request to Reschedule Office Appointment Initial Comment Caller has an 8:05 AM lab appointment today (02/12/2019), needs to cancel/reschedule due to a work meeting. Additional Comment Work meeting is at 8:00 AM, will call back when next available to reschedule. Office hours provided. Disp. Time Disposition Final User 02/12/2019 4:34:57 AM General Information Provided Yes [place], Rosendo Gros Call Closed By: Marinus Maw Transaction Date/Time: 02/12/2019 4:32:04 AM (ET)

## 2019-02-12 NOTE — Telephone Encounter (Signed)
I cancelled patient's lab appointment.

## 2019-02-20 ENCOUNTER — Other Ambulatory Visit: Payer: Self-pay

## 2019-02-20 ENCOUNTER — Ambulatory Visit (INDEPENDENT_AMBULATORY_CARE_PROVIDER_SITE_OTHER): Payer: 59 | Admitting: Family Medicine

## 2019-02-20 ENCOUNTER — Encounter: Payer: Self-pay | Admitting: Family Medicine

## 2019-02-20 VITALS — BP 118/68 | HR 81 | Temp 97.4°F | Ht 65.25 in | Wt 148.0 lb

## 2019-02-20 DIAGNOSIS — Z1231 Encounter for screening mammogram for malignant neoplasm of breast: Secondary | ICD-10-CM | POA: Insufficient documentation

## 2019-02-20 DIAGNOSIS — Z23 Encounter for immunization: Secondary | ICD-10-CM | POA: Diagnosis not present

## 2019-02-20 DIAGNOSIS — J449 Chronic obstructive pulmonary disease, unspecified: Secondary | ICD-10-CM

## 2019-02-20 DIAGNOSIS — E7800 Pure hypercholesterolemia, unspecified: Secondary | ICD-10-CM | POA: Diagnosis not present

## 2019-02-20 DIAGNOSIS — Z0000 Encounter for general adult medical examination without abnormal findings: Secondary | ICD-10-CM | POA: Diagnosis not present

## 2019-02-20 NOTE — Assessment & Plan Note (Signed)
Pt will schedule her own mammogram at [facility]  No changes on self exam  She has f/u with gyn for exam later this month

## 2019-02-20 NOTE — Progress Notes (Signed)
Subjective:    Patient ID: Meredith Farmer, female    DOB: 1966-02-04, 53 y.o.   MRN: [id number]  This visit occurred during the SARS-CoV-2 public health emergency.  Safety protocols were in place, including screening questions prior to the visit, additional usage of staff PPE, and extensive cleaning of exam room while observing appropriate contact time as indicated for disinfecting solutions.    HPI Here for health maintenance exam and to review chronic medical problems    Lost her father in [place]. -that was hard    Wt Readings from Last 3 Encounters:  02/20/19 148 lb (67.1 kg)  12/24/17 162 lb 8 oz (73.7 kg)  12/16/17 163 lb (73.9 kg)  lost weight - related to her grief / dealing with her father's affairs (overseas)  Appetite is ok  She ate less due to being busy and moving more  24.44 kg/m   Not a lot of time for self care  She brought her 9 yo mother back to [place] -living with   Her daughter is in college - in school and working    [facility]) Has an appt. Upcoming - had to re schedule with covid   Mammogram 10/19  [facility]- will make her own appt  Self breast exam - no lumps   Flu vaccine-given today   Colonoscopy 3/14 -no polyps   Tdap 11/17   Zoster status -she had both shingrix vaccines    BP Readings from Last 3 Encounters:  02/20/19 118/68  12/24/17 112/68  12/16/17 125/72   Pulse Readings from Last 3 Encounters:  02/20/19 81  12/24/17 60  12/16/17 81    Hyperlipidemia Lab Results  Component Value Date   CHOL 237 (H) 02/12/2019   CHOL 284 (H) 11/28/2017   CHOL 203 (H) 11/24/2016   Lab Results  Component Value Date   HDL 88.80 02/12/2019   HDL 100.00 11/28/2017   HDL 109.20 11/24/2016   Lab Results  Component Value Date   LDLCALC 136 (H) 02/12/2019   LDLCALC 170 (H) 11/28/2017   LDLCALC 77 11/24/2016   Lab Results  Component Value Date   TRIG 61.0 02/12/2019   TRIG 72.0 11/28/2017   TRIG 83.0 11/24/2016    Lab Results  Component Value Date   CHOLHDL 3 02/12/2019   CHOLHDL 3 11/28/2017   CHOLHDL 2 11/24/2016   Lab Results  Component Value Date   LDLDIRECT 135.9 12/16/2012   LDLDIRECT 143.4 01/23/2012   LDLDIRECT 148.5 02/24/2011   crestor was better tolerated  Has missed a lot of doses  Will get back on it   Baseline high HDL  Walks for exercise   Other labs Results for orders placed or performed in visit on 02/12/19  TSH  Result Value Ref Range   TSH 1.21 0.35 - 4.50 uIU/mL  Lipid panel  Result Value Ref Range   Cholesterol 237 (H) 0 - 200 mg/dL   Triglycerides 61.0 0.0 - 149.0 mg/dL   HDL 88.80 >39.00 mg/dL   VLDL 12.2 0.0 - 40.0 mg/dL   LDL Cholesterol 136 (H) 0 - 99 mg/dL   Total CHOL/HDL Ratio 3    NonHDL 148.08   Comprehensive metabolic panel  Result Value Ref Range   Sodium 140 135 - 145 mEq/L   Potassium 4.3 3.5 - 5.1 mEq/L   Chloride 105 96 - 112 mEq/L   CO2 29 19 - 32 mEq/L   Glucose, Bld 84 70 - 99  mg/dL   BUN 17 6 - 23 mg/dL   Creatinine, Ser 0.70 0.40 - 1.20 mg/dL   Total Bilirubin 0.4 0.2 - 1.2 mg/dL   Alkaline Phosphatase 56 39 - 117 U/L   AST 14 0 - 37 U/L   ALT 11 0 - 35 U/L   Total Protein 6.6 6.0 - 8.3 g/dL   Albumin 4.3 3.5 - 5.2 g/dL   GFR 87.31 >60.00 mL/min   Calcium 9.5 8.4 - 10.5 mg/dL  CBC with Differential/Platelet  Result Value Ref Range   WBC 5.8 4.0 - 10.5 K/uL   RBC 4.19 3.87 - 5.11 Mil/uL   Hemoglobin 12.9 12.0 - 15.0 g/dL   HCT 38.3 36.0 - 46.0 %   MCV 91.3 78.0 - 100.0 fl   MCHC 33.8 30.0 - 36.0 g/dL   RDW 13.3 11.5 - 15.5 %   Platelets 227.0 150.0 - 400.0 K/uL   Neutrophils Relative % 63.3 43.0 - 77.0 %   Lymphocytes Relative 27.1 12.0 - 46.0 %   Monocytes Relative 8.6 3.0 - 12.0 %   Eosinophils Relative 0.9 0.0 - 5.0 %   Basophils Relative 0.1 0.0 - 3.0 %   Neutro Abs 3.7 1.4 - 7.7 K/uL   Lymphs Abs 1.6 0.7 - 4.0 K/uL   Monocytes Absolute 0.5 0.1 - 1.0 K/uL   Eosinophils Absolute 0.1 0.0 - 0.7 K/uL   Basophils  Absolute 0.0 0.0 - 0.1 K/uL    Patient Active Problem List   Diagnosis Date Noted  . Screening mammogram, encounter for 02/20/2019  . Family history of carotid artery stenosis 02/09/2016  . Headache 07/10/2014  . Routine general medical examination at a health care facility 12/15/2012  . COPD (chronic obstructive pulmonary disease) ([facility]) 11/06/2011  . Abnormal CXR 09/06/2011  . Hyperlipidemia 06/07/2007  . GOITER 06/06/2007   Past Medical History:  Diagnosis Date  . Depression   . Goiter   . Hyperlipidemia    Past Surgical History:  Procedure Laterality Date  . DILATION AND CURETTAGE OF UTERUS     Social History   Tobacco Use  . Smoking status: Never Smoker  . Smokeless tobacco: Never Used  Substance Use Topics  . Alcohol use: Yes    Alcohol/week: 0.0 standard drinks    Comment: one drink once a week  . Drug use: No   Family History  Problem Relation Age of Onset  . Depression Mother   . Hyperlipidemia Mother   . Alcohol abuse Father   . Alcohol abuse Brother   . Cancer Paternal Aunt        colon  . Cancer Paternal Uncle        colon  . Heart attack Maternal Grandfather   . Diabetes Maternal Grandmother    Allergies  Allergen Reactions  . Atorvastatin     Muscle pain  . Codeine     Headache and upset stomach  . Hydrocodone-Homatropine     Nausea    Current Outpatient Medications on File Prior to Visit  Medication Sig Dispense Refill  . meloxicam (MOBIC) 15 MG tablet Take 1 tablet (15 mg total) by mouth daily. With food as needed for pain or headache 30 tablet 3  . rosuvastatin (CRESTOR) 5 MG tablet Take 1 tablet (5 mg total) by mouth daily. 90 tablet 1   No current facility-administered medications on file prior to visit.    Review of Systems  Constitutional: Negative for activity change, appetite change, fatigue, fever and unexpected weight change.  HENT: Negative for congestion, ear pain, rhinorrhea, sinus pressure and sore throat.   Eyes: Negative  for pain, redness and visual disturbance.  Respiratory: Negative for cough, shortness of breath and wheezing.   Cardiovascular: Negative for chest pain and palpitations.  Gastrointestinal: Negative for abdominal pain, blood in stool, constipation and diarrhea.  Endocrine: Negative for polydipsia and polyuria.  Genitourinary: Negative for dysuria, frequency and urgency.  Musculoskeletal: Negative for arthralgias, back pain and myalgias.  Skin: Negative for pallor and rash.  Allergic/Immunologic: Negative for environmental allergies.  Neurological: Negative for dizziness, syncope and headaches.  Hematological: Negative for adenopathy. Does not bruise/bleed easily.  Psychiatric/Behavioral: Negative for decreased concentration and dysphoric mood. The patient is not nervous/anxious.        Stressors        Objective:   Physical Exam Constitutional:      General: She is not in acute distress.    Appearance: Normal appearance. She is well-developed and normal weight. She is not ill-appearing or diaphoretic.     Comments: Wt loss noted  HENT:     Head: Normocephalic and atraumatic.     Right Ear: Tympanic membrane, ear canal and external ear normal.     Left Ear: Tympanic membrane, ear canal and external ear normal.     Nose: Nose normal. No congestion.     Mouth/Throat:     Mouth: Mucous membranes are moist.     Pharynx: Oropharynx is clear. No posterior oropharyngeal erythema.  Eyes:     General: No scleral icterus.    Extraocular Movements: Extraocular movements intact.     Conjunctiva/sclera: Conjunctivae normal.     Pupils: Pupils are equal, round, and reactive to light.  Neck:     Thyroid: No thyromegaly.     Vascular: No carotid bruit or JVD.  Cardiovascular:     Rate and Rhythm: Normal rate and regular rhythm.     Pulses: Normal pulses.     Heart sounds: Normal heart sounds. No gallop.   Pulmonary:     Effort: Pulmonary effort is normal. No respiratory distress.      Breath sounds: Normal breath sounds. No wheezing.     Comments: Good air exch Chest:     Chest wall: No tenderness.  Abdominal:     General: Bowel sounds are normal. There is no distension or abdominal bruit.     Palpations: Abdomen is soft. There is no mass.     Tenderness: There is no abdominal tenderness.     Hernia: No hernia is present.  Genitourinary:    Comments:     Musculoskeletal:        General: No tenderness. Normal range of motion.     Cervical back: Normal range of motion and neck supple. No rigidity. No muscular tenderness.     Right lower leg: No edema.     Left lower leg: No edema.  Lymphadenopathy:     Cervical: No cervical adenopathy.  Skin:    General: Skin is warm and dry.     Coloration: Skin is not pale.     Findings: No erythema or rash.     Comments: Solar lentigines diffusely  Some scattered small angiomas  Neurological:     Mental Status: She is alert. Mental status is at baseline.     Cranial Nerves: No cranial nerve deficit.     Motor: No abnormal muscle tone.     Coordination: Coordination normal.     Gait: Gait normal.  Deep Tendon Reflexes: Reflexes are normal and symmetric. Reflexes normal.  Psychiatric:        Mood and Affect: Mood normal.        Cognition and Memory: Cognition and memory normal.           Assessment & Plan:   Problem List Items Addressed This Visit      Respiratory   COPD (chronic obstructive pulmonary disease) (HCC)    No problems currently  Not taking medication        Other   Hyperlipidemia    Disc goals for lipids and reasons to control them Rev last labs with pt Rev low sat fat diet in detail LDL is improved from the past but still not at goal  Will re start low dose crestor      Routine general medical examination at a health care facility - Primary    Reviewed health habits including diet and exercise and skin cancer prevention Reviewed appropriate screening tests for age  Also reviewed  health mt list, fam hx and immunization status , as well as social and family history   See HPI Labs reviewed  Sent for record of shingrix vaccine Pt has f/u with gyn upcoming  She will schedule her own mammogram at [facility]  Disc plan for self care/diet/ exercise  Pt plans to re start her statin medication        Screening mammogram, encounter for    Pt will schedule her own mammogram at [facility]  No changes on self exam  She has f/u with gyn for exam later this month       Other Visit Diagnoses    Need for influenza vaccination       Relevant Orders   Flu Vaccine QUAD 6+ mos PF IM (Fluarix Quad PF) (Completed)

## 2019-02-20 NOTE — Assessment & Plan Note (Addendum)
Reviewed health habits including diet and exercise and skin cancer prevention Reviewed appropriate screening tests for age  Also reviewed health mt list, fam hx and immunization status , as well as social and family history   See HPI Labs reviewed  Sent for record of shingrix vaccine Pt has f/u with gyn upcoming  She will schedule her own mammogram at [facility] for self care/diet/ exercise  Pt plans to re start her statin medication

## 2019-02-20 NOTE — Assessment & Plan Note (Signed)
No problems currently  Not taking medication

## 2019-02-20 NOTE — Assessment & Plan Note (Signed)
Disc goals for lipids and reasons to control them Rev last labs with pt Rev low sat fat diet in detail LDL is improved from the past but still not at goal  Will re start low dose crestor

## 2019-02-20 NOTE — Patient Instructions (Addendum)
Don't forget to schedule your mammogram  Follow up with gyn as planned   Flu shot today  Get back on your crestor  Take care of yourself

## 2019-04-09 IMAGING — CR DG CHEST 2V
2 series · 2 of 2 positions shown · non-contrast
Comparison: Chest radiograph performed 08/14/2011

CLINICAL DATA: Acute onset of left-sided arm pain and chest
tightness. Initial encounter.

EXAM:
CHEST  2 VIEW

[chest pa]
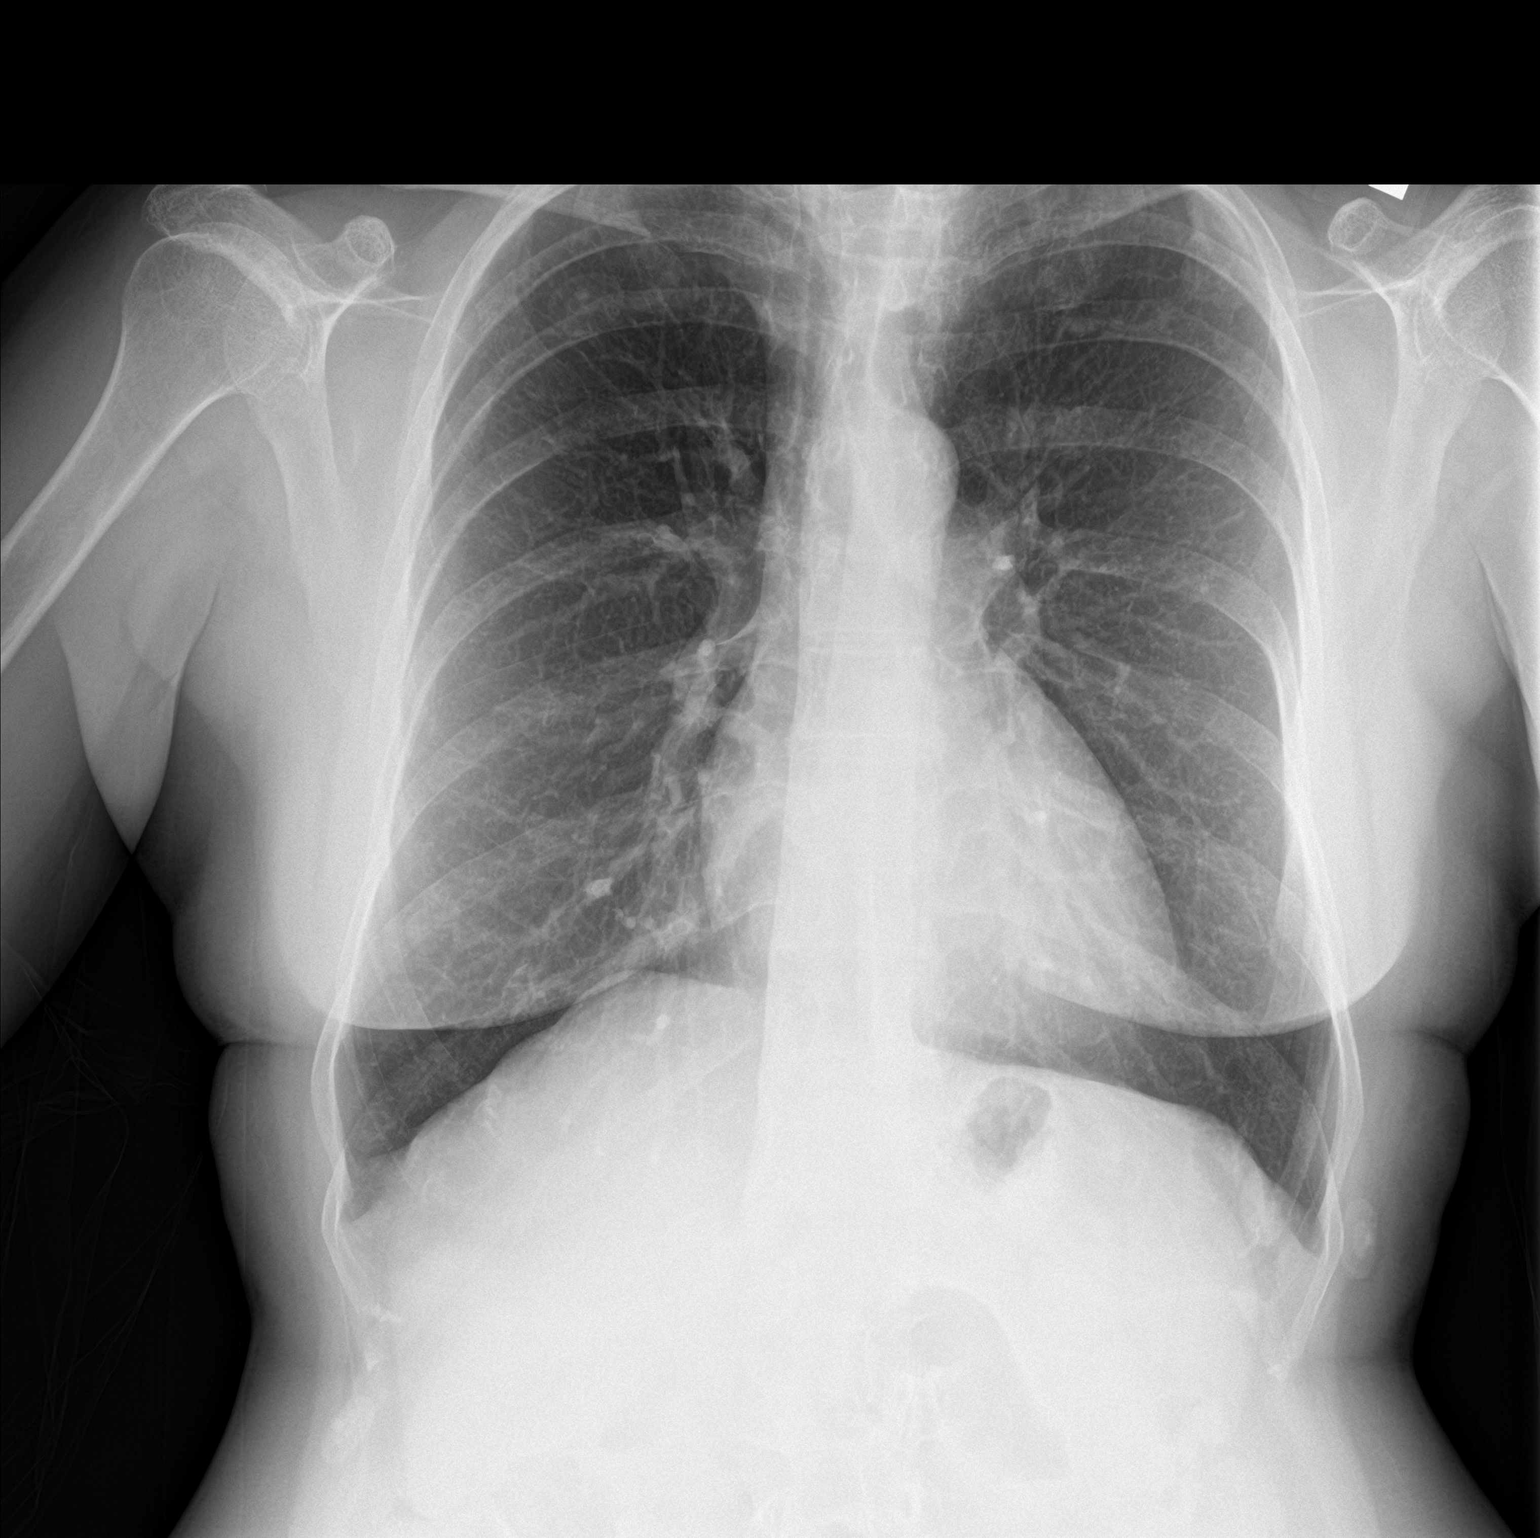

[chest lat]
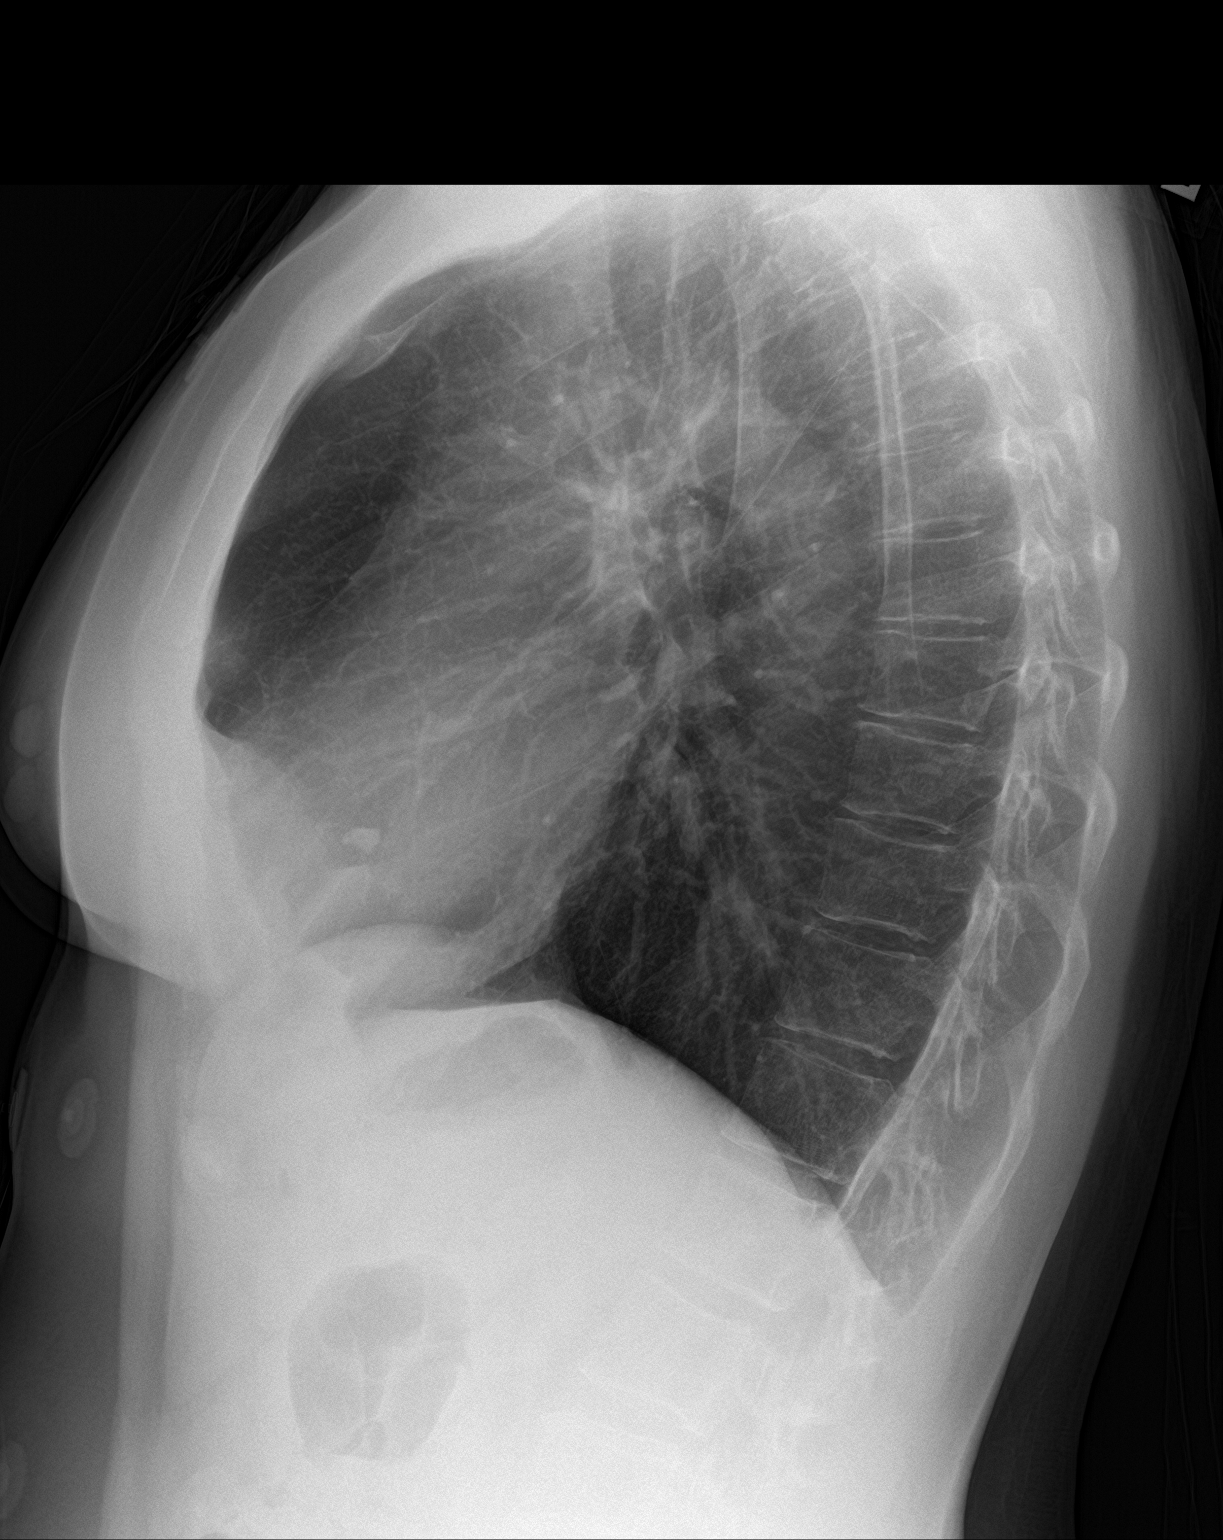

[2 of 2 positions shown; findings below may reference images not displayed]

FINDINGS: The lungs are well-aerated and clear. There is no evidence of focal
opacification, pleural effusion or pneumothorax. A calcified
granuloma is noted at the right lung base.

The heart is normal in size; the mediastinal contour is within
normal limits. No acute osseous abnormalities are seen.
IMPRESSION: No acute cardiopulmonary process seen.

## 2019-11-14 ENCOUNTER — Encounter: Payer: Self-pay | Admitting: Family Medicine

## 2019-11-14 ENCOUNTER — Telehealth: Payer: Self-pay

## 2019-11-14 MED ORDER — PREDNISONE 10 MG PO TABS: ORAL_TABLET | ORAL | 0 refills | Status: DC

## 2019-11-14 NOTE — Telephone Encounter (Signed)
Pt will send pic through mychart

## 2019-11-14 NOTE — Telephone Encounter (Signed)
Pt viewed mychart and responded that she got Dr. Marliss Coots message

## 2019-11-14 NOTE — Telephone Encounter (Signed)
She has mychart- could she please send a picture of the worst area (in good light if possible) and send through mychart ?

## 2019-11-14 NOTE — Telephone Encounter (Signed)
Pt sent pic through mychart. Please review  [facility]

## 2019-11-14 NOTE — Telephone Encounter (Signed)
Thanks for including the pictures  Keep areas cool and clean (soap and water)  Wash anything that may have the plant oil on it (shoes/clothes)   I sent prednisone=take the tapering dose as directed This may make you feel hyper and hungry (will stop when you finish it) If any problems let me know

## 2019-11-14 NOTE — Telephone Encounter (Signed)
I replied to her and sent in prednisone

## 2019-11-14 NOTE — Telephone Encounter (Signed)
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 Has been using cortisone cream, ice packs, and antihistamines without much relief. Pt asking if something can be called in to [facility]. Please advise at [phone].

## 2019-12-16 ENCOUNTER — Telehealth: Payer: Self-pay | Admitting: Family Medicine

## 2019-12-16 DIAGNOSIS — Z0000 Encounter for general adult medical examination without abnormal findings: Secondary | ICD-10-CM

## 2019-12-16 DIAGNOSIS — E7800 Pure hypercholesterolemia, unspecified: Secondary | ICD-10-CM

## 2019-12-16 NOTE — Telephone Encounter (Signed)
-----   Message from Cloyd Stagers, RT sent at 12/12/2019 10:05 AM EDT ----- Regarding: Lab Orders for Wednesday 10.6.2021 Please place lab orders for Wednesday 10.6.2021, office visit for physical on Monday 10.11.2021 Thank you, Dyke Maes RT(R)

## 2019-12-17 ENCOUNTER — Other Ambulatory Visit (INDEPENDENT_AMBULATORY_CARE_PROVIDER_SITE_OTHER): Payer: 59

## 2019-12-17 ENCOUNTER — Other Ambulatory Visit: Payer: Self-pay

## 2019-12-17 DIAGNOSIS — E7800 Pure hypercholesterolemia, unspecified: Secondary | ICD-10-CM | POA: Diagnosis not present

## 2019-12-17 DIAGNOSIS — Z0000 Encounter for general adult medical examination without abnormal findings: Secondary | ICD-10-CM | POA: Diagnosis not present

## 2019-12-17 LAB — CBC WITH DIFFERENTIAL/PLATELET
Basophils Absolute: 0 10*3/uL (ref 0.0–0.1)
Basophils Relative: 0.7 % (ref 0.0–3.0)
Eosinophils Absolute: 0.1 10*3/uL (ref 0.0–0.7)
Eosinophils Relative: 2.1 % (ref 0.0–5.0)
HCT: 39.6 % (ref 36.0–46.0)
Hemoglobin: 13.5 g/dL (ref 12.0–15.0)
Lymphocytes Relative: 41 % (ref 12.0–46.0)
Lymphs Abs: 2 10*3/uL (ref 0.7–4.0)
MCHC: 34.2 g/dL (ref 30.0–36.0)
MCV: 91.5 fl (ref 78.0–100.0)
Monocytes Absolute: 0.4 10*3/uL (ref 0.1–1.0)
Monocytes Relative: 9 % (ref 3.0–12.0)
Neutro Abs: 2.3 10*3/uL (ref 1.4–7.7)
Neutrophils Relative %: 47.2 % (ref 43.0–77.0)
Platelets: 301 10*3/uL (ref 150.0–400.0)
RBC: 4.33 Mil/uL (ref 3.87–5.11)
RDW: 13.1 % (ref 11.5–15.5)
WBC: 5 10*3/uL (ref 4.0–10.5)

## 2019-12-17 LAB — COMPREHENSIVE METABOLIC PANEL
ALT: 9 U/L (ref 0–35)
AST: 14 U/L (ref 0–37)
Albumin: 4.3 g/dL (ref 3.5–5.2)
Alkaline Phosphatase: 56 U/L (ref 39–117)
BUN: 13 mg/dL (ref 6–23)
CO2: 30 mEq/L (ref 19–32)
Calcium: 9.5 mg/dL (ref 8.4–10.5)
Chloride: 104 mEq/L (ref 96–112)
Creatinine, Ser: 0.92 mg/dL (ref 0.40–1.20)
GFR: 70.34 mL/min (ref >60.00)
Glucose, Bld: 82 mg/dL (ref 70–99)
Potassium: 4.7 mEq/L (ref 3.5–5.1)
Sodium: 140 mEq/L (ref 135–145)
Total Bilirubin: 0.4 mg/dL (ref 0.2–1.2)
Total Protein: 6.5 g/dL (ref 6.0–8.3)

## 2019-12-17 LAB — LIPID PANEL
Cholesterol: 294 mg/dL — ABNORMAL HIGH (ref 0–200)
HDL: 103.4 mg/dL (ref >39.00)
LDL Cholesterol: 176 mg/dL — ABNORMAL HIGH (ref 0–99)
NonHDL: 190.1
Total CHOL/HDL Ratio: 3
Triglycerides: 69 mg/dL (ref 0.0–149.0)
VLDL: 13.8 mg/dL (ref 0.0–40.0)

## 2019-12-17 LAB — TSH: TSH: 1.81 u[IU]/mL (ref 0.35–4.50)

## 2019-12-20 ENCOUNTER — Ambulatory Visit (INDEPENDENT_AMBULATORY_CARE_PROVIDER_SITE_OTHER): Payer: 59

## 2019-12-20 ENCOUNTER — Other Ambulatory Visit: Payer: Self-pay

## 2019-12-20 DIAGNOSIS — Z23 Encounter for immunization: Secondary | ICD-10-CM

## 2019-12-22 ENCOUNTER — Ambulatory Visit (INDEPENDENT_AMBULATORY_CARE_PROVIDER_SITE_OTHER): Payer: 59 | Admitting: Family Medicine

## 2019-12-22 ENCOUNTER — Other Ambulatory Visit: Payer: Self-pay

## 2019-12-22 ENCOUNTER — Telehealth: Payer: Self-pay

## 2019-12-22 ENCOUNTER — Encounter: Payer: Self-pay | Admitting: Family Medicine

## 2019-12-22 VITALS — BP 122/80 | HR 76 | Temp 97.0°F | Ht 65.0 in | Wt 160.0 lb

## 2019-12-22 DIAGNOSIS — E7800 Pure hypercholesterolemia, unspecified: Secondary | ICD-10-CM

## 2019-12-22 DIAGNOSIS — Z0000 Encounter for general adult medical examination without abnormal findings: Secondary | ICD-10-CM | POA: Diagnosis not present

## 2019-12-22 MED ORDER — ROSUVASTATIN CALCIUM 5 MG PO TABS: 5.0000 mg | ORAL_TABLET | Freq: Every day | ORAL | 3 refills | Status: DC

## 2019-12-22 MED ORDER — MELOXICAM 15 MG PO TABS: 15.0000 mg | ORAL_TABLET | Freq: Every day | ORAL | 5 refills | Status: DC

## 2019-12-22 NOTE — Patient Instructions (Addendum)
Please schedule your mammogram at [facility] back on your cholesterol medicine   Blood pressure is better on the 2nd check   Take care of yourself   Get back to regular exercise   For cholesterol Avoid red meat/ fried foods/ egg yolks/ fatty breakfast meats/ butter, cheese and high fat dairy/ and shellfish

## 2019-12-22 NOTE — Progress Notes (Signed)
Subjective:    Patient ID: Meredith Farmer, female    DOB: 1965-08-19, 54 y.o.   MRN: [id number]  This visit occurred during the SARS-CoV-2 public health emergency.  Safety protocols were in place, including screening questions prior to the visit, additional usage of staff PPE, and extensive cleaning of exam room while observing appropriate contact time as indicated for disinfecting solutions.    HPI Here for health maintenance exam and to review chronic medical problems    Wt Readings from Last 3 Encounters:  12/22/19 160 lb (72.6 kg)  02/20/19 148 lb (67.1 kg)  12/24/17 162 lb 8 oz (73.7 kg)   26.63 kg/m   Exercise- walking in the evenings  Difficult with schedule at times    covid status  Had her vaccines -pfizer (does not have dates with her) in April Wants a booster when she can get it   Stress -caring for her mother   Pap 3/16- thinks she has one since then  [facility] Is menopausal   Mammogram 10/19  Self breast exam - no lumps   Colonoscopy 3/14  Tdap 11/17 Flu shot -utd this season  Has had the shingrix vaccine   BP Readings from Last 3 Encounters:  12/22/19 132/78  02/20/19 118/68  12/24/17 112/68   Pulse Readings from Last 3 Encounters:  12/22/19 76  02/20/19 81  12/24/17 60   Lab Results  Component Value Date   CREATININE 0.92 12/17/2019   BUN 13 12/17/2019   NA 140 12/17/2019   K 4.7 12/17/2019   CL 104 12/17/2019   CO2 30 12/17/2019   Takes crestor for lipid Lab Results  Component Value Date   CHOL 294 (H) 12/17/2019   CHOL 237 (H) 02/12/2019   CHOL 284 (H) 11/28/2017   Lab Results  Component Value Date   HDL 103.40 12/17/2019   HDL 88.80 02/12/2019   HDL 100.00 11/28/2017   Lab Results  Component Value Date   LDLCALC 176 (H) 12/17/2019   LDLCALC 136 (H) 02/12/2019   LDLCALC 170 (H) 11/28/2017   Lab Results  Component Value Date   TRIG 69.0 12/17/2019   TRIG 61.0 02/12/2019   TRIG 72.0  11/28/2017   Lab Results  Component Value Date   CHOLHDL 3 12/17/2019   CHOLHDL 3 02/12/2019   CHOLHDL 3 11/28/2017   Lab Results  Component Value Date   LDLDIRECT 135.9 12/16/2012   LDLDIRECT 143.4 01/23/2012   LDLDIRECT 148.5 02/24/2011  she is off her rosuvastatin -px ran out  LDL is up  Husband is feeding her "too much"   Lab Results  Component Value Date   TSH 1.81 12/17/2019   Lab Results  Component Value Date   WBC 5.0 12/17/2019   HGB 13.5 12/17/2019   HCT 39.6 12/17/2019   MCV 91.5 12/17/2019   PLT 301.0 12/17/2019   Lab Results  Component Value Date   ALT 9 12/17/2019   AST 14 12/17/2019   ALKPHOS 56 12/17/2019   BILITOT 0.4 12/17/2019     Takes meloxicam prn   Patient Active Problem List   Diagnosis Date Noted  . Screening mammogram, encounter for 02/20/2019  . Family history of carotid artery stenosis 02/09/2016  . Headache 07/10/2014  . Routine general medical examination at a health care facility 12/15/2012  . COPD (chronic obstructive pulmonary disease) ([place]) 11/06/2011  . Abnormal CXR 09/06/2011  . Hyperlipidemia 06/07/2007  . GOITER 06/06/2007   Past  Medical History:  Diagnosis Date  . Depression   . Goiter   . Hyperlipidemia    Past Surgical History:  Procedure Laterality Date  . DILATION AND CURETTAGE OF UTERUS     Social History   Tobacco Use  . Smoking status: Never Smoker  . Smokeless tobacco: Never Used  Substance Use Topics  . Alcohol use: Yes    Alcohol/week: 0.0 standard drinks    Comment: one drink once a week  . Drug use: No   Family History  Problem Relation Age of Onset  . Depression Mother   . Hyperlipidemia Mother   . Alcohol abuse Father   . Alcohol abuse Brother   . Cancer Paternal Aunt        colon  . Cancer Paternal Uncle        colon  . Heart attack Maternal Grandfather   . Diabetes Maternal Grandmother    Allergies  Allergen Reactions  . Atorvastatin     Muscle pain  . Codeine     Headache  and upset stomach  . Hydrocodone-Homatropine     Nausea    No current outpatient medications on file prior to visit.   No current facility-administered medications on file prior to visit.    Review of Systems  Constitutional: Positive for fatigue. Negative for activity change, appetite change, fever and unexpected weight change.  HENT: Negative for congestion, ear pain, rhinorrhea, sinus pressure and sore throat.   Eyes: Negative for pain, redness and visual disturbance.  Respiratory: Negative for cough, shortness of breath and wheezing.   Cardiovascular: Negative for chest pain and palpitations.  Gastrointestinal: Negative for abdominal pain, blood in stool, constipation and diarrhea.  Endocrine: Negative for polydipsia and polyuria.  Genitourinary: Negative for dysuria, frequency and urgency.  Musculoskeletal: Negative for arthralgias, back pain and myalgias.  Skin: Negative for pallor and rash.  Allergic/Immunologic: Negative for environmental allergies.  Neurological: Negative for dizziness, syncope and headaches.       Occ headaches  Hematological: Negative for adenopathy. Does not bruise/bleed easily.  Psychiatric/Behavioral: Negative for decreased concentration and dysphoric mood. The patient is not nervous/anxious.        Stressors  Wants to move her mother here from the [place]        Objective:   Physical Exam Constitutional:      General: She is not in acute distress.    Appearance: Normal appearance. She is well-developed and normal weight. She is not ill-appearing or diaphoretic.  HENT:     Head: Normocephalic and atraumatic.     Right Ear: Tympanic membrane, ear canal and external ear normal.     Left Ear: Tympanic membrane, ear canal and external ear normal.     Nose: Nose normal. No congestion.     Mouth/Throat:     Mouth: Mucous membranes are moist.     Pharynx: Oropharynx is clear. No posterior oropharyngeal erythema.  Eyes:     General: No scleral  icterus.    Extraocular Movements: Extraocular movements intact.     Conjunctiva/sclera: Conjunctivae normal.     Pupils: Pupils are equal, round, and reactive to light.  Neck:     Thyroid: No thyromegaly.     Vascular: No carotid bruit or JVD.  Cardiovascular:     Rate and Rhythm: Normal rate and regular rhythm.     Pulses: Normal pulses.     Heart sounds: Normal heart sounds. No gallop.   Pulmonary:     Effort: Pulmonary effort  is normal. No respiratory distress.     Breath sounds: Normal breath sounds. No wheezing.     Comments: Good air exch Chest:     Chest wall: No tenderness.  Abdominal:     General: Bowel sounds are normal. There is no distension or abdominal bruit.     Palpations: Abdomen is soft. There is no mass.     Tenderness: There is no abdominal tenderness.     Hernia: No hernia is present.  Genitourinary:    Comments: Sees gyn for pelvic and breast exam Musculoskeletal:        General: No tenderness. Normal range of motion.     Cervical back: Normal range of motion and neck supple. No rigidity. No muscular tenderness.     Right lower leg: No edema.     Left lower leg: No edema.     Comments: No kyphosis   Lymphadenopathy:     Cervical: No cervical adenopathy.  Skin:    General: Skin is warm and dry.     Coloration: Skin is not pale.     Findings: No erythema or rash.     Comments: Some lentigines   Neurological:     Mental Status: She is alert. Mental status is at baseline.     Cranial Nerves: No cranial nerve deficit.     Motor: No abnormal muscle tone.     Coordination: Coordination normal.     Gait: Gait normal.     Deep Tendon Reflexes: Reflexes are normal and symmetric. Reflexes normal.  Psychiatric:        Mood and Affect: Mood normal.        Cognition and Memory: Cognition and memory normal.           Assessment & Plan:   Problem List Items Addressed This Visit      Other   Hyperlipidemia    Disc goals for lipids and reasons to  control them Rev last labs with pt Rev low sat fat diet in detail LDL is up to 176 since she ran out of rosuvastatin  Refilled this  Enc her to be more compliant with medicine and watch diet      Relevant Medications   rosuvastatin (CRESTOR) 5 MG tablet   Routine general medical examination at a health care facility - Primary    Reviewed health habits including diet and exercise and skin cancer prevention Reviewed appropriate screening tests for age  Also reviewed health mt list, fam hx and immunization status , as well as social and family history   See HPI Labs reviewed  Given # to schedule her mammogram  Had covid vaccine and will get booster when able  Enc her to eat better/exercise and get back on rosuvastatin  bp was better 2nd check  Some stressors

## 2019-12-22 NOTE — Telephone Encounter (Signed)
Pt received flu vaccine on 10/9

## 2019-12-22 NOTE — Assessment & Plan Note (Signed)
Disc goals for lipids and reasons to control them Rev last labs with pt Rev low sat fat diet in detail LDL is up to 176 since she ran out of rosuvastatin  Refilled this  Enc her to be more compliant with medicine and watch diet

## 2019-12-22 NOTE — Assessment & Plan Note (Signed)
Reviewed health habits including diet and exercise and skin cancer prevention Reviewed appropriate screening tests for age  Also reviewed health mt list, fam hx and immunization status , as well as social and family history   See HPI Labs reviewed  Given # to schedule her mammogram  Had covid vaccine and will get booster when able  Enc her to eat better/exercise and get back on rosuvastatin  bp was better 2nd check  Some stressors

## 2019-12-22 NOTE — Telephone Encounter (Signed)
[place] Night - Client Nonclinical Telephone Record  [facility] Client [place] Night - Client Client Site [facility], Roque Lias - MD Contact Type Call Who Is Calling Patient / Member / Family / Caregiver Caller Name [place] Phone Number [phone] Patient Name Meredith Farmer Patient DOB 1965/09/21 Call Type Message Only Information Provided Reason for Call Request for General Office Information Initial Comment Caller states she is there to get a flu shot. Additional Comment Provided office hours and advised to call back. Non symptomatic. Caller states her and her family are there for their flu shots. Disp. Time Disposition Final User 12/20/2019 9:17:09 AM General Information Provided Yes Ovid Curd, [place] Call Closed By: Rolan Bucco Transaction Date/Time: 12/20/2019 9:13:25 AM (ET)

## 2020-04-20 LAB — HM PAP SMEAR

## 2020-05-27 IMAGING — DX DG TOE GREAT 2+V*L*
3 series · 4 of 4 positions shown · non-contrast
Comparison: None.

CLINICAL DATA: Laceration along the dorsal great toe of the left
foot. Fall.

EXAM:
LEFT GREAT TOE

[toe ap]
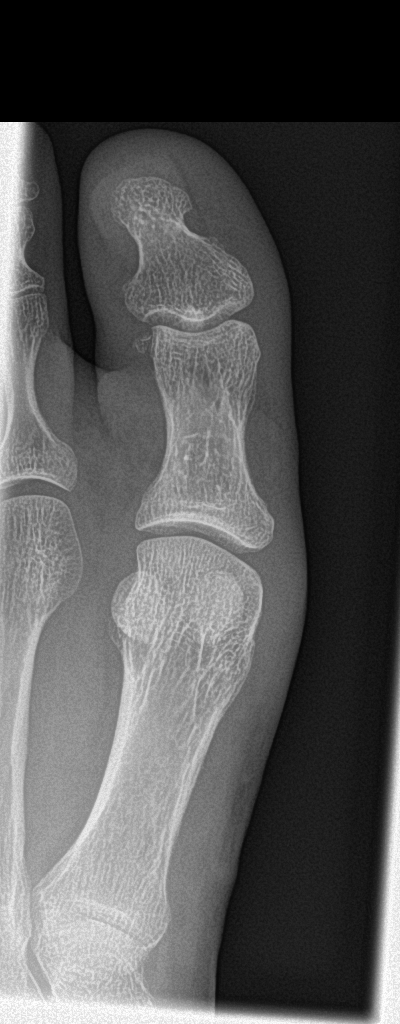

[Series 2: toe obl · 0.14mm/px · 2 of 2 slices shown]
[im 1/2]
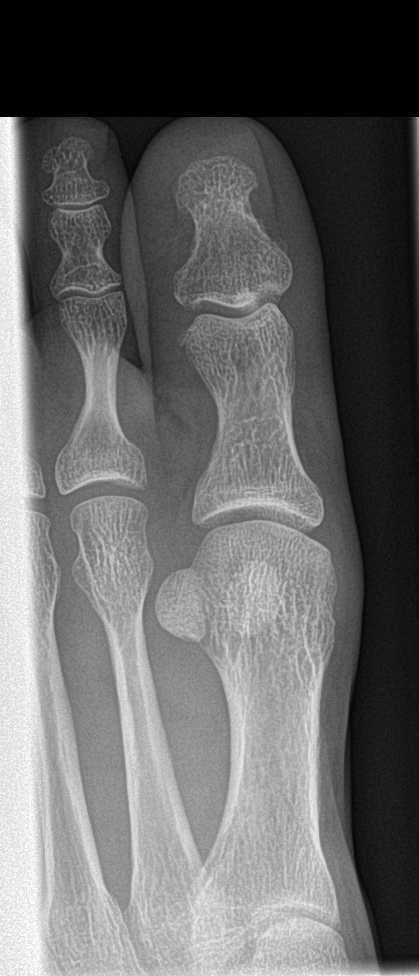
[im 2/2]
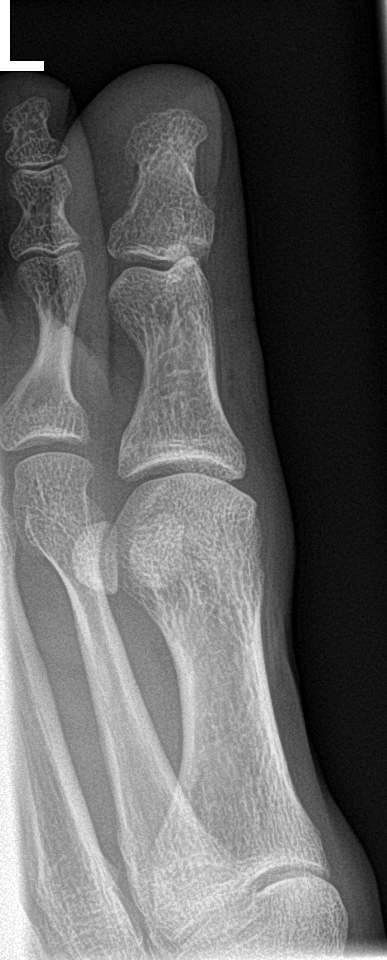

[toe lat]
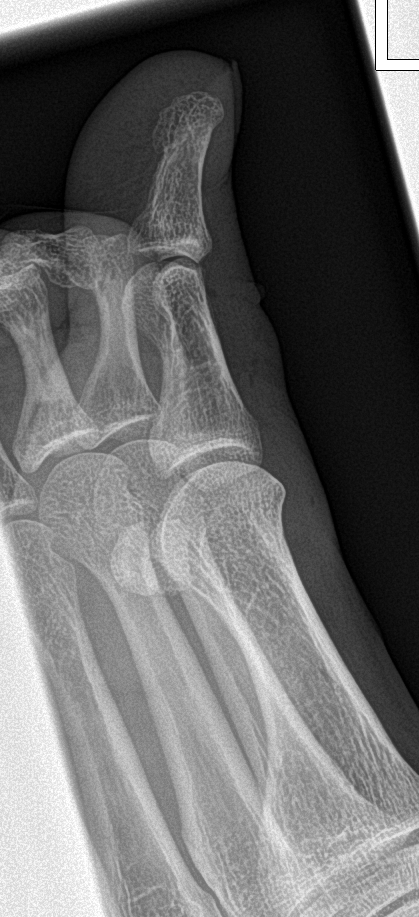

[4 of 4 positions shown; findings below may reference images not displayed]

FINDINGS: Well corticated ossicle along the lateral head of the proximal
phalanx of the great toe. Given the well corticated appearance I am
skeptical that this is an acute finding.

No fracture or foreign body observed. There is likely some dorsal
soft tissue swelling along the great toe.
IMPRESSION: 1. Dorsal soft tissue swelling.  No acute bony findings.

## 2020-12-22 ENCOUNTER — Other Ambulatory Visit: Payer: Self-pay

## 2020-12-22 ENCOUNTER — Encounter: Payer: Self-pay | Admitting: Family Medicine

## 2020-12-22 ENCOUNTER — Ambulatory Visit (INDEPENDENT_AMBULATORY_CARE_PROVIDER_SITE_OTHER): Payer: 59 | Admitting: Family Medicine

## 2020-12-22 VITALS — BP 112/74 | HR 57 | Temp 97.2°F | Ht 65.0 in | Wt 153.0 lb

## 2020-12-22 DIAGNOSIS — E7800 Pure hypercholesterolemia, unspecified: Secondary | ICD-10-CM | POA: Diagnosis not present

## 2020-12-22 DIAGNOSIS — Z0000 Encounter for general adult medical examination without abnormal findings: Secondary | ICD-10-CM

## 2020-12-22 LAB — CBC WITH DIFFERENTIAL/PLATELET
Basophils Absolute: 0 10*3/uL (ref 0.0–0.1)
Basophils Relative: 0.7 % (ref 0.0–3.0)
Eosinophils Absolute: 0 10*3/uL (ref 0.0–0.7)
Eosinophils Relative: 0.9 % (ref 0.0–5.0)
HCT: 40.8 % (ref 36.0–46.0)
Hemoglobin: 13.4 g/dL (ref 12.0–15.0)
Lymphocytes Relative: 35 % (ref 12.0–46.0)
Lymphs Abs: 1.7 10*3/uL (ref 0.7–4.0)
MCHC: 32.9 g/dL (ref 30.0–36.0)
MCV: 92.4 fl (ref 78.0–100.0)
Monocytes Absolute: 0.4 10*3/uL (ref 0.1–1.0)
Monocytes Relative: 7.3 % (ref 3.0–12.0)
Neutro Abs: 2.8 10*3/uL (ref 1.4–7.7)
Neutrophils Relative %: 56.1 % (ref 43.0–77.0)
Platelets: 238 10*3/uL (ref 150.0–400.0)
RBC: 4.42 Mil/uL (ref 3.87–5.11)
RDW: 13.3 % (ref 11.5–15.5)
WBC: 5 10*3/uL (ref 4.0–10.5)

## 2020-12-22 LAB — LIPID PANEL
Cholesterol: 287 mg/dL — ABNORMAL HIGH (ref 0–200)
HDL: 106.4 mg/dL (ref >39.00)
LDL Cholesterol: 159 mg/dL — ABNORMAL HIGH (ref 0–99)
NonHDL: 180.55
Total CHOL/HDL Ratio: 3
Triglycerides: 109 mg/dL (ref 0.0–149.0)
VLDL: 21.8 mg/dL (ref 0.0–40.0)

## 2020-12-22 LAB — COMPREHENSIVE METABOLIC PANEL
ALT: 11 U/L (ref 0–35)
AST: 18 U/L (ref 0–37)
Albumin: 4.6 g/dL (ref 3.5–5.2)
Alkaline Phosphatase: 57 U/L (ref 39–117)
BUN: 9 mg/dL (ref 6–23)
CO2: 31 mEq/L (ref 19–32)
Calcium: 9.8 mg/dL (ref 8.4–10.5)
Chloride: 103 mEq/L (ref 96–112)
Creatinine, Ser: 0.71 mg/dL (ref 0.40–1.20)
GFR: 95.66 mL/min (ref >60.00)
Glucose, Bld: 86 mg/dL (ref 70–99)
Potassium: 4.8 mEq/L (ref 3.5–5.1)
Sodium: 139 mEq/L (ref 135–145)
Total Bilirubin: 0.5 mg/dL (ref 0.2–1.2)
Total Protein: 6.9 g/dL (ref 6.0–8.3)

## 2020-12-22 LAB — TSH: TSH: 1.15 u[IU]/mL (ref 0.35–5.50)

## 2020-12-22 NOTE — Progress Notes (Signed)
Subjective:    Patient ID: Meredith Farmer, female    DOB: 18-Aug-1965, 55 y.o.   MRN: [id number]  This visit occurred during the SARS-CoV-2 public health emergency.  Safety protocols were in place, including screening questions prior to the visit, additional usage of staff PPE, and extensive cleaning of exam room while observing appropriate contact time as indicated for disinfecting solutions.   HPI Here for health maintenance exam and to review chronic medical problems    Wt Readings from Last 3 Encounters:  12/22/20 153 lb (69.4 kg)  12/22/19 160 lb (72.6 kg)  02/20/19 148 lb (67.1 kg)   25.46 kg/m  Gets out at least 3 d per week 1.5 to 3 mi walk  Doing well overall   Taking care of herself  Eats healthy  As a rule, not soda  Cut back on bread  More fruits and veggies   Covid status - planning booster  Flu shot - will get with covid booster at [facility] this weekend  Zoster status-had shingrix  Tdap 01/2016  Mammogram 12/21 at [facility] breast exam- goes to gyn yearly  She has a breast exam and mammogram at [facility]    Pap last year at gyn -Dr Julien Girt, sent for report  Menopause years ago   Colonoscopy 3/14  Depression screen [facility] 2/9 12/22/2020 12/22/2019 02/20/2019 12/01/2016  Decreased Interest 0 0 0 0  Down, Depressed, Hopeless 0 0 0 0  PHQ - 2 Score 0 0 0 0  Altered sleeping - 0 0 -  Tired, decreased energy - 0 0 -  Change in appetite - 0 0 -  Feeling bad or failure about yourself  - 0 0 -  Trouble concentrating - 0 0 -  Moving slowly or fidgety/restless - 0 0 -  Suicidal thoughts - 0 0 -  PHQ-9 Score - 0 0 -  Difficult doing work/chores - Not difficult at all Not difficult at all -     BP Readings from Last 3 Encounters:  12/22/20 112/74  12/22/19 122/80  02/20/19 118/68   Pulse Readings from Last 3 Encounters:  12/22/20 (!) 57  12/22/19 76  02/20/19 81     Hyperlipidemia Lab Results  Component Value Date   CHOL 294 (H) 12/17/2019    HDL 103.40 12/17/2019   LDLCALC 176 (H) 12/17/2019   LDLDIRECT 135.9 12/16/2012   TRIG 69.0 12/17/2019   CHOLHDL 3 12/17/2019   Due for labs  Takes crestor 5 mg daily - now only takes it on and off / gives her pain in arms/in armpit  Diet is good  Family h/o carotid stenosis    She has h/o goiter  No change per pt   Patient Active Problem List   Diagnosis Date Noted   Screening mammogram, encounter for 02/20/2019   Family history of carotid artery stenosis 02/09/2016   Routine general medical examination at a health care facility 12/15/2012   COPD (chronic obstructive pulmonary disease) ([place]) 11/06/2011   Abnormal CXR 09/06/2011   Hyperlipidemia 06/07/2007   Goiter 06/06/2007   Past Medical History:  Diagnosis Date   Depression    Goiter    Hyperlipidemia    Past Surgical History:  Procedure Laterality Date   DILATION AND CURETTAGE OF UTERUS     Social History   Tobacco Use   Smoking status: Never   Smokeless tobacco: Never  Substance Use Topics   Alcohol use: Yes    Alcohol/week: 0.0 standard drinks  Comment: one drink once a week   Drug use: No   Family History  Problem Relation Age of Onset   Depression Mother    Hyperlipidemia Mother    Alcohol abuse Father    Alcohol abuse Brother    Cancer Paternal Aunt        colon   Cancer Paternal Uncle        colon   Heart attack Maternal Grandfather    Diabetes Maternal Grandmother    Allergies  Allergen Reactions   Atorvastatin     Muscle pain   Codeine     Headache and upset stomach   Hydrocodone Bit-Homatrop Mbr     Nausea    Current Outpatient Medications on File Prior to Visit  Medication Sig Dispense Refill   meloxicam (MOBIC) 15 MG tablet Take 1 tablet (15 mg total) by mouth daily. With food as needed for pain or headache 30 tablet 5   rosuvastatin (CRESTOR) 5 MG tablet Take 1 tablet (5 mg total) by mouth daily. 90 tablet 3   No current facility-administered medications on file prior to  visit.    Review of Systems  Constitutional:  Negative for activity change, appetite change, fatigue, fever and unexpected weight change.  HENT:  Negative for congestion, ear pain, rhinorrhea, sinus pressure and sore throat.   Eyes:  Negative for pain, redness and visual disturbance.  Respiratory:  Negative for cough, shortness of breath and wheezing.   Cardiovascular:  Negative for chest pain and palpitations.  Gastrointestinal:  Negative for abdominal pain, blood in stool, constipation and diarrhea.  Endocrine: Negative for polydipsia and polyuria.  Genitourinary:  Negative for dysuria, frequency and urgency.  Musculoskeletal:  Negative for arthralgias, back pain and myalgias.       Pain in axillary area when she takes crestor  Skin:  Negative for pallor and rash.  Allergic/Immunologic: Negative for environmental allergies.  Neurological:  Negative for dizziness, syncope, light-headedness, numbness and headaches.  Hematological:  Negative for adenopathy. Does not bruise/bleed easily.  Psychiatric/Behavioral:  Negative for decreased concentration and dysphoric mood. The patient is not nervous/anxious.       Objective:   Physical Exam Constitutional:      General: She is not in acute distress.    Appearance: Normal appearance. She is well-developed and normal weight. She is not ill-appearing or diaphoretic.  HENT:     Head: Normocephalic and atraumatic.     Right Ear: Tympanic membrane, ear canal and external ear normal.     Left Ear: Tympanic membrane, ear canal and external ear normal.     Nose: Nose normal. No congestion.     Mouth/Throat:     Mouth: Mucous membranes are moist.     Pharynx: Oropharynx is clear. No posterior oropharyngeal erythema.  Eyes:     General: No scleral icterus.    Extraocular Movements: Extraocular movements intact.     Conjunctiva/sclera: Conjunctivae normal.     Pupils: Pupils are equal, round, and reactive to light.  Neck:     Thyroid: No  thyromegaly.     Vascular: No carotid bruit or JVD.  Cardiovascular:     Rate and Rhythm: Normal rate and regular rhythm.     Pulses: Normal pulses.     Heart sounds: Normal heart sounds.    No gallop.  Pulmonary:     Effort: Pulmonary effort is normal. No respiratory distress.     Breath sounds: Normal breath sounds. No wheezing.     Comments:  Good air exch Chest:     Chest wall: No tenderness.  Abdominal:     General: Bowel sounds are normal. There is no distension or abdominal bruit.     Palpations: Abdomen is soft. There is no mass.     Tenderness: There is no abdominal tenderness.     Hernia: No hernia is present.  Genitourinary:    Comments: Breast and pelvic exam are done by gyn provider Musculoskeletal:        General: No tenderness. Normal range of motion.     Cervical back: Normal range of motion and neck supple. No rigidity. No muscular tenderness.     Right lower leg: No edema.     Left lower leg: No edema.     Comments: No kyphosis   Lymphadenopathy:     Cervical: No cervical adenopathy.  Skin:    General: Skin is warm and dry.     Coloration: Skin is not pale.     Findings: No erythema or rash.     Comments: Solar lentigines diffusely   Neurological:     Mental Status: She is alert. Mental status is at baseline.     Cranial Nerves: No cranial nerve deficit.     Motor: No abnormal muscle tone.     Coordination: Coordination normal.     Gait: Gait normal.     Deep Tendon Reflexes: Reflexes are normal and symmetric. Reflexes normal.  Psychiatric:        Mood and Affect: Mood normal.        Cognition and Memory: Cognition and memory normal.          Assessment & Plan:   Problem List Items Addressed This Visit       Other   Hyperlipidemia    Disc goals for lipids and reasons to control them Rev last labs with pt Rev low sat fat diet in detail Labs ordered  Pt only takes crestor occasionally due to some muscle complaints  Mild/may be able to  tolerate Discussed pros/cons and reasons to treat so she can decide       Relevant Orders   Lipid panel   Routine general medical examination at a health care facility - Primary    Reviewed health habits including diet and exercise and skin cancer prevention Reviewed appropriate screening tests for age  Also reviewed health mt list, fam hx and immunization status , as well as social and family history   See HPI Labs ordered  Planning flu shot this weekend with covid booster at [facility] utd, due in December utd gyn care, sent for last pap report  Colonoscopy utd PHQ score of 0 Good health habits Enc vit D supplement for bone health      Relevant Orders   CBC with Differential/Platelet   Comprehensive metabolic panel   Lipid panel   TSH

## 2020-12-22 NOTE — Assessment & Plan Note (Signed)
Disc goals for lipids and reasons to control them Rev last labs with pt Rev low sat fat diet in detail Labs ordered  Pt only takes crestor occasionally due to some muscle complaints  Mild/may be able to tolerate Discussed pros/cons and reasons to treat so she can decide

## 2020-12-22 NOTE — Patient Instructions (Addendum)
Get your flu shot and covid booster as planned   Take care of yourself  Keep walking   Wear sun protection   Get your mammogram in December   Take the crestor if you tolerate it   Take 1000 iu of vitamin D3 daily for bone health   Labs today   Stop up front so we can send for your last pap report

## 2020-12-22 NOTE — Assessment & Plan Note (Signed)
Reviewed health habits including diet and exercise and skin cancer prevention Reviewed appropriate screening tests for age  Also reviewed health mt list, fam hx and immunization status , as well as social and family history   See HPI Labs ordered  Planning flu shot this weekend with covid booster at [facility] utd, due in December utd gyn care, sent for last pap report  Colonoscopy utd PHQ score of 0 Good health habits Enc vit D supplement for bone health

## 2020-12-22 NOTE — Assessment & Plan Note (Signed)
Saw Dr Chalmers Cater in the past Had a scan in 2004 No further problems

## 2020-12-27 ENCOUNTER — Other Ambulatory Visit: Payer: Self-pay | Admitting: Family Medicine

## 2021-03-03 LAB — HM MAMMOGRAPHY

## 2021-03-17 ENCOUNTER — Encounter: Payer: Self-pay | Admitting: Family Medicine

## 2021-12-19 ENCOUNTER — Telehealth: Payer: Self-pay | Admitting: Family Medicine

## 2021-12-19 DIAGNOSIS — Z0000 Encounter for general adult medical examination without abnormal findings: Secondary | ICD-10-CM

## 2021-12-19 DIAGNOSIS — E049 Nontoxic goiter, unspecified: Secondary | ICD-10-CM

## 2021-12-19 DIAGNOSIS — E7800 Pure hypercholesterolemia, unspecified: Secondary | ICD-10-CM

## 2021-12-19 NOTE — Telephone Encounter (Signed)
-----   Message from Ellamae Sia sent at 12/13/2021  4:03 PM EDT ----- Regarding: Lab orders for Tuesday, 10.10.23 Patient is scheduled for CPX labs, please order future labs, Thanks , Karna Christmas

## 2021-12-20 ENCOUNTER — Other Ambulatory Visit (INDEPENDENT_AMBULATORY_CARE_PROVIDER_SITE_OTHER): Payer: 59

## 2021-12-20 DIAGNOSIS — Z0000 Encounter for general adult medical examination without abnormal findings: Secondary | ICD-10-CM | POA: Diagnosis not present

## 2021-12-20 DIAGNOSIS — E7800 Pure hypercholesterolemia, unspecified: Secondary | ICD-10-CM

## 2021-12-20 LAB — COMPREHENSIVE METABOLIC PANEL
ALT: 9 U/L (ref 0–35)
AST: 16 U/L (ref 0–37)
Albumin: 4.4 g/dL (ref 3.5–5.2)
Alkaline Phosphatase: 58 U/L (ref 39–117)
BUN: 18 mg/dL (ref 6–23)
CO2: 28 mEq/L (ref 19–32)
Calcium: 9.6 mg/dL (ref 8.4–10.5)
Chloride: 102 mEq/L (ref 96–112)
Creatinine, Ser: 0.89 mg/dL (ref 0.40–1.20)
GFR: 72.43 mL/min (ref >60.00)
Glucose, Bld: 89 mg/dL (ref 70–99)
Potassium: 4.9 mEq/L (ref 3.5–5.1)
Sodium: 137 mEq/L (ref 135–145)
Total Bilirubin: 0.3 mg/dL (ref 0.2–1.2)
Total Protein: 6.7 g/dL (ref 6.0–8.3)

## 2021-12-20 LAB — CBC WITH DIFFERENTIAL/PLATELET
Basophils Absolute: 0 10*3/uL (ref 0.0–0.1)
Basophils Relative: 0.7 % (ref 0.0–3.0)
Eosinophils Absolute: 0.1 10*3/uL (ref 0.0–0.7)
Eosinophils Relative: 2.2 % (ref 0.0–5.0)
HCT: 38.5 % (ref 36.0–46.0)
Hemoglobin: 13.2 g/dL (ref 12.0–15.0)
Lymphocytes Relative: 40.1 % (ref 12.0–46.0)
Lymphs Abs: 2 10*3/uL (ref 0.7–4.0)
MCHC: 34.3 g/dL (ref 30.0–36.0)
MCV: 91.1 fl (ref 78.0–100.0)
Monocytes Absolute: 0.5 10*3/uL (ref 0.1–1.0)
Monocytes Relative: 10.8 % (ref 3.0–12.0)
Neutro Abs: 2.3 10*3/uL (ref 1.4–7.7)
Neutrophils Relative %: 46.2 % (ref 43.0–77.0)
Platelets: 302 10*3/uL (ref 150.0–400.0)
RBC: 4.22 Mil/uL (ref 3.87–5.11)
RDW: 13.1 % (ref 11.5–15.5)
WBC: 4.9 10*3/uL (ref 4.0–10.5)

## 2021-12-20 LAB — LIPID PANEL
Cholesterol: 265 mg/dL — ABNORMAL HIGH (ref 0–200)
HDL: 104.8 mg/dL (ref >39.00)
LDL Cholesterol: 147 mg/dL — ABNORMAL HIGH (ref 0–99)
NonHDL: 160.29
Total CHOL/HDL Ratio: 3
Triglycerides: 68 mg/dL (ref 0.0–149.0)
VLDL: 13.6 mg/dL (ref 0.0–40.0)

## 2021-12-21 ENCOUNTER — Ambulatory Visit (INDEPENDENT_AMBULATORY_CARE_PROVIDER_SITE_OTHER): Payer: 59

## 2021-12-21 DIAGNOSIS — Z23 Encounter for immunization: Secondary | ICD-10-CM

## 2021-12-21 LAB — TSH: TSH: 1.48 u[IU]/mL (ref 0.35–5.50)

## 2021-12-22 ENCOUNTER — Other Ambulatory Visit: Payer: Self-pay | Admitting: Family Medicine

## 2021-12-22 NOTE — Telephone Encounter (Addendum)
Pt just had labs with an abnormal lipid panel is it okay to refill this dose of med?, please advise   CPE is scheduled for 12/29/21

## 2021-12-23 ENCOUNTER — Encounter: Payer: 59 | Admitting: Family Medicine

## 2021-12-26 ENCOUNTER — Other Ambulatory Visit: Payer: 59

## 2021-12-29 ENCOUNTER — Ambulatory Visit (INDEPENDENT_AMBULATORY_CARE_PROVIDER_SITE_OTHER): Payer: 59 | Admitting: Family Medicine

## 2021-12-29 ENCOUNTER — Encounter: Payer: Self-pay | Admitting: Family Medicine

## 2021-12-29 VITALS — BP 106/62 | HR 79 | Temp 97.6°F | Ht 64.75 in | Wt 154.4 lb

## 2021-12-29 DIAGNOSIS — E7800 Pure hypercholesterolemia, unspecified: Secondary | ICD-10-CM

## 2021-12-29 DIAGNOSIS — Z0000 Encounter for general adult medical examination without abnormal findings: Secondary | ICD-10-CM | POA: Diagnosis not present

## 2021-12-29 DIAGNOSIS — E049 Nontoxic goiter, unspecified: Secondary | ICD-10-CM

## 2021-12-29 DIAGNOSIS — Z1211 Encounter for screening for malignant neoplasm of colon: Secondary | ICD-10-CM

## 2021-12-29 MED ORDER — MELOXICAM 15 MG PO TABS: 15.0000 mg | ORAL_TABLET | Freq: Every day | ORAL | 2 refills | Status: DC | PRN

## 2021-12-29 NOTE — Patient Instructions (Addendum)
Work on Editor, commissioning- we are learning this is more important   Try to get 1200-1500 mg of calcium per day with at least 1000 iu of vitamin D - for bone health  Colonoscopy will be due after march  Call [facility] to schedule   [facility]  [phone]  Figure out how often you can tolerate crestor  See if three times a week is tolerable   Use a pill box

## 2021-12-29 NOTE — Assessment & Plan Note (Signed)
Reviewed health habits including diet and exercise and skin cancer prevention Reviewed appropriate screening tests for age  Also reviewed health mt list, fam hx and immunization status , as well as social and family history   See HPI Labs reviewed  Pap utd 04/2020 Nl breast exam today  Mammogram due in dec/pt aware  Enc more compliance with ca and D Enc more strength training  Colonoscopy due after march, ref done for pt to call and schedule

## 2021-12-29 NOTE — Assessment & Plan Note (Signed)
Disc goals for lipids and reasons to control them Rev last labs with pt Rev low sat fat diet in detail LDL of 147 Goal under 100 Good diet  Enc strongly to use crestor more compliantly and see if she can tolerate 3 d per week If not, zetia may be option on its own or in conj with statin

## 2021-12-29 NOTE — Progress Notes (Signed)
Subjective:    Patient ID: Meredith Farmer, female    DOB: [birth year], 56 y.o.   MRN: [id number]  HPI Here for health maintenance exam and to review chronic medical problems    Wt Readings from Last 3 Encounters:  12/29/21 154 lb 6 oz (70 kg)  12/22/20 153 lb (69.4 kg)  12/22/19 160 lb (72.6 kg)   25.89 kg/m  Working  Traveling more - had a wedding, going to the [place]   Feeling good   Exercise-would like to more / time limited  Loves to hike on the weekends and kayaks  Needs to fit in more Outdoors person -works out side some   Immunization History  Administered Date(s) Administered   Hepatitis A 03/13/1994   Hepatitis B 03/13/1994   Influenza Split 12/12/2010, 12/28/2011, 11/18/2014   Influenza Whole 12/12/2006, 12/13/2009, 12/05/2017   Influenza,inj,Quad PF,6+ Mos 01/28/2016, 02/20/2019, 12/20/2019, 12/21/2021   Influenza-Unspecified 12/16/2012, 12/06/2013, 11/30/2016   Td 05/05/2005   Tdap 02/09/2016   Zoster Recombinat (Shingrix) 02/23/2018, 09/09/2018   Health Maintenance Due  Topic Date Due   COVID-19 Vaccine (1) Never done   Hepatitis C Screening  Never done    Had pap 04/2020  Sees gyn  Gyn visit 04/2020 with Dr Julien Girt with pap   Mammogram 02/2021 Self breast exam: does not do    Ca and D intake - sometimes   Colonoscopy 05/2012 with 10 y recall  Due after march   Exercise : outdoor   H/o thyroid enlargement-no changes  Lab Results  Component Value Date   TSH 1.48 12/20/2021    Hyperlipidemia Lab Results  Component Value Date   CHOL 265 (H) 12/20/2021   CHOL 287 (H) 12/22/2020   CHOL 294 (H) 12/17/2019   Lab Results  Component Value Date   HDL 104.80 12/20/2021   HDL 106.40 12/22/2020   HDL 103.40 12/17/2019   Lab Results  Component Value Date   LDLCALC 147 (H) 12/20/2021   LDLCALC 159 (H) 12/22/2020   LDLCALC 176 (H) 12/17/2019   Lab Results  Component Value Date   TRIG 68.0 12/20/2021   TRIG 109.0 12/22/2020   TRIG  69.0 12/17/2019   Lab Results  Component Value Date   CHOLHDL 3 12/20/2021   CHOLHDL 3 12/22/2020   CHOLHDL 3 12/17/2019   Lab Results  Component Value Date   LDLDIRECT 135.9 12/16/2012   LDLDIRECT 143.4 01/23/2012   LDLDIRECT 148.5 02/24/2011   Crestor 5 mg daily - takes it about one day per week  Compliance is not optimal  Takes some fish oil    Other labs Lab Results  Component Value Date   CREATININE 0.89 12/20/2021   BUN 18 12/20/2021   NA 137 12/20/2021   K 4.9 12/20/2021   CL 102 12/20/2021   CO2 28 12/20/2021   Lab Results  Component Value Date   ALT 9 12/20/2021   AST 16 12/20/2021   ALKPHOS 58 12/20/2021   BILITOT 0.3 12/20/2021   Lab Results  Component Value Date   WBC 4.9 12/20/2021   HGB 13.2 12/20/2021   HCT 38.5 12/20/2021   MCV 91.1 12/20/2021   PLT 302.0 12/20/2021   Patient Active Problem List   Diagnosis Date Noted   Colon cancer screening 12/29/2021   Screening mammogram, encounter for 02/20/2019   Family history of carotid artery stenosis 02/09/2016   Routine general medical examination at a health care facility 12/15/2012   COPD (chronic obstructive pulmonary disease) ([place]) 11/06/2011  Abnormal CXR 09/06/2011   Hyperlipidemia 06/07/2007   Goiter 06/06/2007   Past Medical History:  Diagnosis Date   Depression    Goiter    Hyperlipidemia    Past Surgical History:  Procedure Laterality Date   DILATION AND CURETTAGE OF UTERUS     Social History   Tobacco Use   Smoking status: Never   Smokeless tobacco: Never  Substance Use Topics   Alcohol use: Yes    Alcohol/week: 0.0 standard drinks of alcohol    Comment: one drink once a week   Drug use: No   Family History  Problem Relation Age of Onset   Depression Mother    Hyperlipidemia Mother    Alcohol abuse Father    Alcohol abuse Brother    Cancer Paternal Aunt        colon   Cancer Paternal Uncle        colon   Heart attack Maternal Grandfather    Diabetes Maternal  Grandmother    Allergies  Allergen Reactions   Atorvastatin     Muscle pain   Codeine     Headache and upset stomach   Hydrocodone Bit-Homatrop Mbr     Nausea    Current Outpatient Medications on File Prior to Visit  Medication Sig Dispense Refill   rosuvastatin (CRESTOR) 5 MG tablet TAKE 1 TABLET DAILY 90 tablet 1   No current facility-administered medications on file prior to visit.     Review of Systems  Constitutional:  Negative for activity change, appetite change, fatigue, fever and unexpected weight change.  HENT:  Negative for congestion, ear pain, rhinorrhea, sinus pressure and sore throat.   Eyes:  Negative for pain, redness and visual disturbance.  Respiratory:  Negative for cough, shortness of breath and wheezing.   Cardiovascular:  Negative for chest pain and palpitations.  Gastrointestinal:  Negative for abdominal pain, blood in stool, constipation and diarrhea.  Endocrine: Negative for polydipsia and polyuria.  Genitourinary:  Negative for dysuria, frequency and urgency.  Musculoskeletal:  Negative for arthralgias, back pain and myalgias.  Skin:  Negative for pallor and rash.  Allergic/Immunologic: Negative for environmental allergies.  Neurological:  Negative for dizziness, syncope and headaches.  Hematological:  Negative for adenopathy. Does not bruise/bleed easily.  Psychiatric/Behavioral:  Negative for decreased concentration and dysphoric mood. The patient is not nervous/anxious.        Objective:   Physical Exam Constitutional:      General: She is not in acute distress.    Appearance: Normal appearance. She is well-developed and normal weight. She is not ill-appearing or diaphoretic.  HENT:     Head: Normocephalic and atraumatic.     Right Ear: Tympanic membrane, ear canal and external ear normal.     Left Ear: Tympanic membrane, ear canal and external ear normal.     Nose: Nose normal. No congestion.     Mouth/Throat:     Mouth: Mucous membranes  are moist.     Pharynx: Oropharynx is clear. No posterior oropharyngeal erythema.  Eyes:     General: No scleral icterus.    Extraocular Movements: Extraocular movements intact.     Conjunctiva/sclera: Conjunctivae normal.     Pupils: Pupils are equal, round, and reactive to light.  Neck:     Thyroid: No thyromegaly.     Vascular: No carotid bruit or JVD.  Cardiovascular:     Rate and Rhythm: Normal rate and regular rhythm.     Pulses: Normal pulses.  Heart sounds: Normal heart sounds.     No gallop.  Pulmonary:     Effort: Pulmonary effort is normal. No respiratory distress.     Breath sounds: Normal breath sounds. No wheezing.     Comments: Good air exch Chest:     Chest wall: No tenderness.  Abdominal:     General: Bowel sounds are normal. There is no distension or abdominal bruit.     Palpations: Abdomen is soft. There is no mass.     Tenderness: There is no abdominal tenderness.     Hernia: No hernia is present.  Genitourinary:    Comments: Breast exam: No mass, nodules, thickening, tenderness, bulging, retraction, inflamation, nipple discharge or skin changes noted.  No axillary or clavicular LA.     Musculoskeletal:        General: No tenderness. Normal range of motion.     Cervical back: Normal range of motion and neck supple. No rigidity. No muscular tenderness.     Right lower leg: No edema.     Left lower leg: No edema.     Comments: No kyphosis   Lymphadenopathy:     Cervical: No cervical adenopathy.  Skin:    General: Skin is warm and dry.     Coloration: Skin is not pale.     Findings: No erythema or rash.     Comments: Mildy tanned Solar lentigines diffusely   Neurological:     Mental Status: She is alert. Mental status is at baseline.     Cranial Nerves: No cranial nerve deficit.     Motor: No abnormal muscle tone.     Coordination: Coordination normal.     Gait: Gait normal.     Deep Tendon Reflexes: Reflexes are normal and symmetric. Reflexes  normal.  Psychiatric:        Mood and Affect: Mood normal.        Cognition and Memory: Cognition and memory normal.           Assessment & Plan:   Problem List Items Addressed This Visit       Endocrine   Goiter    No change in exam No symptoms Lab Results  Component Value Date   TSH 1.48 12/20/2021           Other   Colon cancer screening    Colonoscopy due after march Ref done  Pt will call to schedule       Relevant Orders   Ambulatory referral to Gastroenterology   Hyperlipidemia    Disc goals for lipids and reasons to control them Rev last labs with pt Rev low sat fat diet in detail LDL of 147 Goal under 100 Good diet  Enc strongly to use crestor more compliantly and see if she can tolerate 3 d per week If not, zetia may be option on its own or in conj with statin       Routine general medical examination at a health care facility - Primary    Reviewed health habits including diet and exercise and skin cancer prevention Reviewed appropriate screening tests for age  Also reviewed health mt list, fam hx and immunization status , as well as social and family history   See HPI Labs reviewed  Pap utd 04/2020 Nl breast exam today  Mammogram due in dec/pt aware  Enc more compliance with ca and D Enc more strength training  Colonoscopy due after march, ref done for pt to call and schedule

## 2021-12-29 NOTE — Assessment & Plan Note (Signed)
Colonoscopy due after march Ref done  Pt will call to schedule

## 2021-12-29 NOTE — Assessment & Plan Note (Signed)
No change in exam No symptoms Lab Results  Component Value Date   TSH 1.48 12/20/2021

## 2022-02-27 LAB — HM MAMMOGRAPHY

## 2022-03-02 ENCOUNTER — Encounter: Payer: Self-pay | Admitting: Family Medicine

## 2022-06-20 ENCOUNTER — Other Ambulatory Visit: Payer: Self-pay | Admitting: Family

## 2022-06-21 ENCOUNTER — Encounter: Payer: Self-pay | Admitting: Gastroenterology

## 2022-10-11 LAB — HM DEXA SCAN

## 2022-10-16 ENCOUNTER — Telehealth: Payer: Self-pay | Admitting: Family Medicine

## 2022-10-16 NOTE — Telephone Encounter (Signed)
Patient called in stating that they need the oral form of the vaccine sent In if Dr Milinda Antis is able to send in.

## 2022-10-16 NOTE — Telephone Encounter (Signed)
Patient called in stating that the [facility] in [place] will give them the typhoid vaccine that they are needing for travel.However,they would need the order from their psp.She would like to know  Phone: [phone]  Fax: [phone]     if Dr Milinda Antis could send it  ?   [id number] - [place], [place] - [address] Phone: [phone]  Fax: [phone]

## 2022-10-16 NOTE — Telephone Encounter (Signed)
Where is she traveling? When is she leaving?  Has she been to a travel clinic/ health dept or similar place- does she need other vaccines or had other vaccines recently?  Is she on or recently had any immuno suppresive medications including steroids?

## 2022-10-17 MED ORDER — TYPHOID VACCINE PO CPDR: 1.0000 | DELAYED_RELEASE_CAPSULE | ORAL | 0 refills | Status: DC

## 2022-10-17 NOTE — Telephone Encounter (Signed)
Please remind her to check the side effect profile when she gets the medication

## 2022-10-17 NOTE — Telephone Encounter (Signed)
Daughter sent mychart message please review

## 2022-10-18 NOTE — Telephone Encounter (Signed)
Sent mychart letting them (daughter) know

## 2022-11-15 ENCOUNTER — Telehealth: Payer: Self-pay | Admitting: Family Medicine

## 2022-11-15 NOTE — Telephone Encounter (Signed)
Patient called in stating that she has pink eye,and she is wondering if she can have a rx sent in without her being seen? She said that both eyes are red and crusted over.She think that she may have picked it up from the plane.

## 2022-11-15 NOTE — Telephone Encounter (Signed)
Can't prescribe med without an appt., please schedule with any available provider

## 2022-11-16 ENCOUNTER — Ambulatory Visit (INDEPENDENT_AMBULATORY_CARE_PROVIDER_SITE_OTHER): Payer: 59 | Admitting: Family Medicine

## 2022-11-16 ENCOUNTER — Encounter: Payer: Self-pay | Admitting: Family Medicine

## 2022-11-16 VITALS — BP 110/68 | HR 75 | Temp 97.5°F | Ht 64.75 in | Wt 155.2 lb

## 2022-11-16 DIAGNOSIS — R0981 Nasal congestion: Secondary | ICD-10-CM | POA: Diagnosis not present

## 2022-11-16 DIAGNOSIS — H1032 Unspecified acute conjunctivitis, left eye: Secondary | ICD-10-CM | POA: Insufficient documentation

## 2022-11-16 MED ORDER — AMOXICILLIN 500 MG PO CAPS: 1000.0000 mg | ORAL_CAPSULE | Freq: Two times a day (BID) | ORAL | 0 refills | Status: DC

## 2022-11-16 MED ORDER — NEOMYCIN-POLYMYXIN-DEXAMETH 3.5-10000-0.1 OP SUSP: 2.0000 [drp] | Freq: Four times a day (QID) | OPHTHALMIC | 0 refills | Status: DC

## 2022-11-16 NOTE — Assessment & Plan Note (Signed)
Acute, also most likely secondary to viral infection but will provide a prescription for amoxicillin to cover for sinus infection given change in mucus and patient's upcoming trip again out of the country.  If not improving with hydration and rest, complete course of amoxicillin 2 tabs twice daily for 10 days.  Hold off on flu shot until current infection symptoms have resolved.

## 2022-11-16 NOTE — Progress Notes (Signed)
Patient ID: Meredith Farmer, female    DOB: [birth year], 57 y.o.   MRN: [id number]  This visit was conducted in person.  BP 110/68 (BP Location: Left Arm, Patient Position: Sitting, Cuff Size: Normal)   Pulse 75   Temp (!) 97.5 F (36.4 C) (Temporal)   Ht 5' 4.75" (1.645 m)   Wt 155 lb 4 oz (70.4 kg)   SpO2 98%   BMI 26.03 kg/m    CC:  Chief Complaint  Patient presents with   Conjunctivitis    Subjective:   HPI: Meredith Farmer is a 57 y.o. female presenting on 11/16/2022 for Conjunctivitis   She reports new onset 2 days ago... left eye irration and redness, discharge. ST started 1 week ago.  Clear nasal discharge,,, has progressed some to yellow productive phelegm.  No SOB. No wheeze.  No face pain or ear pain. No vision change.  No body aches.  Some fatigue but not sleeping well with time change.   Took COVID home test... negative.   She has been travelling recently to [place].    She is using [facility] for symptoms.  Relevant past medical, surgical, family and social history reviewed and updated as indicated. Interim medical history since our last visit reviewed. Allergies and medications reviewed and updated. Outpatient Medications Prior to Visit  Medication Sig Dispense Refill   meloxicam (MOBIC) 15 MG tablet Take 1 tablet (15 mg total) by mouth daily as needed for pain (headache). With food as needed for pain or headache 30 tablet 2   rosuvastatin (CRESTOR) 5 MG tablet TAKE 1 TABLET DAILY 90 tablet 1   typhoid (VIVOTIF) DR capsule Take 1 capsule by mouth every other day. 4 capsule 0   No facility-administered medications prior to visit.     Per HPI unless specifically indicated in ROS section below Review of Systems  Constitutional:  Negative for fatigue and fever.  HENT:  Negative for congestion.   Eyes:  Negative for pain.  Respiratory:  Negative for cough and shortness of breath.   Cardiovascular:  Negative for chest pain, palpitations and  leg swelling.  Gastrointestinal:  Negative for abdominal pain.  Genitourinary:  Negative for dysuria and vaginal bleeding.  Musculoskeletal:  Negative for back pain.  Neurological:  Negative for syncope, light-headedness and headaches.  Psychiatric/Behavioral:  Negative for dysphoric mood.    Objective:  BP 110/68 (BP Location: Left Arm, Patient Position: Sitting, Cuff Size: Normal)   Pulse 75   Temp (!) 97.5 F (36.4 C) (Temporal)   Ht 5' 4.75" (1.645 m)   Wt 155 lb 4 oz (70.4 kg)   SpO2 98%   BMI 26.03 kg/m   Wt Readings from Last 3 Encounters:  11/16/22 155 lb 4 oz (70.4 kg)  12/29/21 154 lb 6 oz (70 kg)  12/22/20 153 lb (69.4 kg)      Physical Exam Vitals and nursing note reviewed.  Constitutional:      General: She is not in acute distress.    Appearance: Normal appearance. She is well-developed. She is not ill-appearing or toxic-appearing.  HENT:     Head: Normocephalic.     Right Ear: Hearing, tympanic membrane, ear canal and external ear normal.     Left Ear: Hearing, tympanic membrane, ear canal and external ear normal.     Nose: Nose normal.  Eyes:     General: Lids are normal. Lids are everted, no foreign bodies appreciated.  Left eye: No discharge.     Extraocular Movements:     Right eye: Normal extraocular motion.     Left eye: Normal extraocular motion.     Conjunctiva/sclera:     Right eye: Right conjunctiva is not injected. No exudate.    Left eye: Left conjunctiva is injected. No exudate.    Pupils: Pupils are equal, round, and reactive to light.  Neck:     Thyroid: No thyroid mass or thyromegaly.     Vascular: No carotid bruit.     Trachea: Trachea normal.  Cardiovascular:     Rate and Rhythm: Normal rate and regular rhythm.     Heart sounds: Normal heart sounds, S1 normal and S2 normal. No murmur heard.    No gallop.  Pulmonary:     Effort: Pulmonary effort is normal. No respiratory distress.     Breath sounds: Normal breath sounds. No  wheezing, rhonchi or rales.  Abdominal:     General: Bowel sounds are normal. There is no distension or abdominal bruit.     Palpations: Abdomen is soft. There is no fluid wave or mass.     Tenderness: There is no abdominal tenderness. There is no guarding or rebound.     Hernia: No hernia is present.  Musculoskeletal:     Cervical back: Normal range of motion and neck supple.  Lymphadenopathy:     Cervical: No cervical adenopathy.  Skin:    General: Skin is warm and dry.     Findings: No rash.  Neurological:     Mental Status: She is alert.     Cranial Nerves: No cranial nerve deficit.     Sensory: No sensory deficit.  Psychiatric:        Mood and Affect: Mood is not anxious or depressed.        Speech: Speech normal.        Behavior: Behavior normal. Behavior is cooperative.        Judgment: Judgment normal.       Results for orders placed or performed in visit on 03/02/22  [facility] MAMMOGRAPHY  Result Value Ref Range   HM Mammogram 0-4 Bi-Rad 0-4 Bi-Rad, Self Reported Normal    Assessment and Plan  Acute conjunctivitis of left eye, unspecified acute conjunctivitis type Assessment & Plan: Acute, most likely secondary to viral conjunctivitis but given going out of the country and significant irritation will treat with antibiotic steroid drops for bacterial infection prevention and soothing symptomatic care.   Nasal congestion Assessment & Plan: Acute, also most likely secondary to viral infection but will provide a prescription for amoxicillin to cover for sinus infection given change in mucus and patient's upcoming trip again out of the country.  If not improving with hydration and rest, complete course of amoxicillin 2 tabs twice daily for 10 days.  Hold off on flu shot until current infection symptoms have resolved.   Other orders -     Amoxicillin; Take 2 capsules (1,000 mg total) by mouth 2 (two) times daily.  Dispense: 40 capsule; Refill: 0 -      Neomycin-Polymyxin-Dexameth; Place 2 drops into the left eye every 6 (six) hours.  Dispense: 5 mL; Refill: 0    No follow-ups on file.   Kerby Nora, MD

## 2022-11-16 NOTE — Assessment & Plan Note (Signed)
Acute, most likely secondary to viral conjunctivitis but given going out of the country and significant irritation will treat with antibiotic steroid drops for bacterial infection prevention and soothing symptomatic care.

## 2022-12-25 ENCOUNTER — Telehealth: Payer: Self-pay | Admitting: Family Medicine

## 2022-12-25 DIAGNOSIS — E7800 Pure hypercholesterolemia, unspecified: Secondary | ICD-10-CM

## 2022-12-25 DIAGNOSIS — Z0000 Encounter for general adult medical examination without abnormal findings: Secondary | ICD-10-CM

## 2022-12-25 NOTE — Telephone Encounter (Signed)
-----   Message from Lovena Neighbours sent at 12/13/2022  1:36 PM EDT ----- Regarding: Labs for Tuesday 10.15.24 Please put physical lab orders in future. Thank you, Denny Peon

## 2022-12-26 ENCOUNTER — Other Ambulatory Visit (INDEPENDENT_AMBULATORY_CARE_PROVIDER_SITE_OTHER): Payer: Managed Care, Other (non HMO)

## 2022-12-26 DIAGNOSIS — E7800 Pure hypercholesterolemia, unspecified: Secondary | ICD-10-CM

## 2022-12-26 DIAGNOSIS — Z0000 Encounter for general adult medical examination without abnormal findings: Secondary | ICD-10-CM

## 2022-12-26 LAB — CBC WITH DIFFERENTIAL/PLATELET
Basophils Absolute: 0.1 10*3/uL (ref 0.0–0.1)
Basophils Relative: 1.1 % (ref 0.0–3.0)
Eosinophils Absolute: 0.1 10*3/uL (ref 0.0–0.7)
Eosinophils Relative: 2.1 % (ref 0.0–5.0)
HCT: 40.7 % (ref 36.0–46.0)
Hemoglobin: 13.1 g/dL (ref 12.0–15.0)
Lymphocytes Relative: 39.7 % (ref 12.0–46.0)
Lymphs Abs: 1.9 10*3/uL (ref 0.7–4.0)
MCHC: 32.3 g/dL (ref 30.0–36.0)
MCV: 93.6 fL (ref 78.0–100.0)
Monocytes Absolute: 0.4 10*3/uL (ref 0.1–1.0)
Monocytes Relative: 9.1 % (ref 3.0–12.0)
Neutro Abs: 2.4 10*3/uL (ref 1.4–7.7)
Neutrophils Relative %: 48 % (ref 43.0–77.0)
Platelets: 251 10*3/uL (ref 150.0–400.0)
RBC: 4.35 Mil/uL (ref 3.87–5.11)
RDW: 13.8 % (ref 11.5–15.5)
WBC: 4.9 10*3/uL (ref 4.0–10.5)

## 2022-12-26 LAB — COMPREHENSIVE METABOLIC PANEL
ALT: 11 U/L (ref 0–35)
AST: 16 U/L (ref 0–37)
Albumin: 4.4 g/dL (ref 3.5–5.2)
Alkaline Phosphatase: 50 U/L (ref 39–117)
BUN: 12 mg/dL (ref 6–23)
CO2: 31 meq/L (ref 19–32)
Calcium: 9.7 mg/dL (ref 8.4–10.5)
Chloride: 104 meq/L (ref 96–112)
Creatinine, Ser: 0.74 mg/dL (ref 0.40–1.20)
GFR: 89.75 mL/min (ref >60.00)
Glucose, Bld: 81 mg/dL (ref 70–99)
Potassium: 4.7 meq/L (ref 3.5–5.1)
Sodium: 141 meq/L (ref 135–145)
Total Bilirubin: 0.5 mg/dL (ref 0.2–1.2)
Total Protein: 6.7 g/dL (ref 6.0–8.3)

## 2022-12-26 LAB — LIPID PANEL
Cholesterol: 229 mg/dL — ABNORMAL HIGH (ref 0–200)
HDL: 104.9 mg/dL (ref >39.00)
LDL Cholesterol: 112 mg/dL — ABNORMAL HIGH (ref 0–99)
NonHDL: 124.19
Total CHOL/HDL Ratio: 2
Triglycerides: 59 mg/dL (ref 0.0–149.0)
VLDL: 11.8 mg/dL (ref 0.0–40.0)

## 2022-12-26 LAB — TSH: TSH: 1.66 u[IU]/mL (ref 0.35–5.50)

## 2023-01-01 NOTE — Progress Notes (Unsigned)
Subjective:    Patient ID: Meredith Farmer, female    DOB: [birth year], 57 y.o.   MRN: [id number]  HPI  Here for health maintenance exam and to review chronic medical problems   Wt Readings from Last 3 Encounters:  01/02/23 153 lb 6 oz (69.6 kg)  11/16/22 155 lb 4 oz (70.4 kg)  12/29/21 154 lb 6 oz (70 kg)   25.52 kg/m  Vitals:   01/02/23 0801  BP: (!) 102/58  Pulse: 71  Temp: 98.6 F (37 C)  SpO2: 97%    Immunization History  Administered Date(s) Administered   Hepatitis A 03/13/1994   Hepatitis B 03/13/1994   Influenza Split 12/12/2010, 12/28/2011, 11/18/2014   Influenza Whole 12/12/2006, 12/13/2009, 12/05/2017   Influenza, Seasonal, Injecte, Preservative Fre 01/02/2023   Influenza,inj,Quad PF,6+ Mos 01/28/2016, 02/20/2019, 12/20/2019, 12/21/2021   Influenza-Unspecified 12/16/2012, 12/06/2013, 11/30/2016   Td 05/05/2005   Tdap 02/09/2016   Zoster Recombinant(Shingrix) 02/23/2018, 09/09/2018    Health Maintenance Due  Topic Date Due   Hepatitis C Screening  Never done   Colonoscopy  05/21/2022   Not much time for self care   Physically feeling ok    Flu shot - today   Mammogram 02/2022  Self breast exam-no lumps   Gyn health PAP 04/2020 Sees gyn Dr Renaldo Fiddler    Colon cancer screening -colonoscopy 05/2012   is due    Bone health  Alendronate  Dexa  Falls- none Fractures-none  Supplements - vitamin D3   some calcium  Exercise - walking   No recent dermatology visits  Is considering one    Mood    01/02/2023    8:18 AM 12/29/2021   10:07 AM 12/22/2020   11:20 AM 12/22/2019   12:40 PM 02/20/2019    1:49 PM  Depression screen PHQ 2/9  Decreased Interest 0 0 0 0 0  Down, Depressed, Hopeless 0 0 0 0 0  PHQ - 2 Score 0 0 0 0 0  Altered sleeping 0   0 0  Tired, decreased energy 0   0 0  Change in appetite 0   0 0  Feeling bad or failure about yourself  0   0 0  Trouble concentrating 0   0 0  Moving slowly or fidgety/restless 0   0 0   Suicidal thoughts 0   0 0  PHQ-9 Score 0   0 0  Difficult doing work/chores Not difficult at all   Not difficult at all Not difficult at all   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  Taking care of her mother -cannot be left alone      History of goiter  Lab Results  Component Value Date   TSH 1.66 12/26/2022     Hyperlipidemia Lab Results  Component Value Date   CHOL 229 (H) 12/26/2022   CHOL 265 (H) 12/20/2021   CHOL 287 (H) 12/22/2020   Lab Results  Component Value Date   HDL 104.90 12/26/2022   HDL 104.80 12/20/2021   HDL 106.40 12/22/2020   Lab Results  Component Value Date   LDLCALC 112 (H) 12/26/2022   LDLCALC 147 (H) 12/20/2021   LDLCALC 159 (H) 12/22/2020   Lab Results  Component Value Date   TRIG 59.0 12/26/2022   TRIG 68.0 12/20/2021   TRIG 109.0 12/22/2020   Lab Results  Component Value Date   CHOLHDL 2 12/26/2022   CHOLHDL 3 12/20/2021   CHOLHDL 3 12/22/2020   Lab Results  Component Value Date   LDLDIRECT 135.9 12/16/2012   LDLDIRECT 143.4 01/23/2012   LDLDIRECT 148.5 02/24/2011   Crestor 5 mg daily - taking regularly now   Lab Results  Component Value Date   NA 141 12/26/2022   K 4.7 12/26/2022   CO2 31 12/26/2022   GLUCOSE 81 12/26/2022   BUN 12 12/26/2022   CREATININE 0.74 12/26/2022   CALCIUM 9.7 12/26/2022   GFR 89.75 12/26/2022   GFRNONAA >60 10/28/2016   Lab Results  Component Value Date   ALT 11 12/26/2022   AST 16 12/26/2022   ALKPHOS 50 12/26/2022   BILITOT 0.5 12/26/2022   Lab Results  Component Value Date   WBC 4.9 12/26/2022   HGB 13.1 12/26/2022   HCT 40.7 12/26/2022   MCV 93.6 12/26/2022   PLT 251.0 12/26/2022        Patient Active Problem List   Diagnosis Date Noted   Osteoporosis 01/02/2023   Colon cancer screening 12/29/2021   Screening mammogram, encounter for 02/20/2019   Family history of carotid artery stenosis 02/09/2016   Routine general medical examination at a health care facility  12/15/2012   Hyperlipidemia 06/07/2007   Goiter 06/06/2007   Past Medical History:  Diagnosis Date   Depression    Goiter    Hyperlipidemia    Past Surgical History:  Procedure Laterality Date   DILATION AND CURETTAGE OF UTERUS     Social History   Tobacco Use   Smoking status: Never   Smokeless tobacco: Never  Substance Use Topics   Alcohol use: Yes    Alcohol/week: 0.0 standard drinks of alcohol    Comment: one drink once a week   Drug use: No   Family History  Problem Relation Age of Onset   Depression Mother    Hyperlipidemia Mother    Alcohol abuse Father    Alcohol abuse Brother    Cancer Paternal Aunt        colon   Cancer Paternal Uncle        colon   Heart attack Maternal Grandfather    Diabetes Maternal Grandmother    Allergies  Allergen Reactions   Atorvastatin     Muscle pain   Codeine     Headache and upset stomach   Hydrocodone Bit-Homatrop Mbr     Nausea    Current Outpatient Medications on File Prior to Visit  Medication Sig Dispense Refill   alendronate (FOSAMAX) 70 MG tablet Take 70 mg by mouth once a week.     Cholecalciferol (VITAMIN D-3 PO) Take 1 capsule by mouth daily.     neomycin-polymyxin b-dexamethasone (MAXITROL) 3.5-10000-0.1 SUSP Place 2 drops into the left eye every 6 (six) hours. 5 mL 0   No current facility-administered medications on file prior to visit.    Review of Systems  Constitutional:  Negative for activity change, appetite change, fatigue, fever and unexpected weight change.  HENT:  Negative for congestion, ear pain, rhinorrhea, sinus pressure and sore throat.   Eyes:  Negative for pain, redness and visual disturbance.  Respiratory:  Negative for cough, shortness of breath and wheezing.   Cardiovascular:  Negative for chest pain and palpitations.  Gastrointestinal:  Negative for abdominal pain, blood in stool, constipation and diarrhea.  Endocrine: Negative for polydipsia and polyuria.  Genitourinary:  Negative  for dysuria, frequency and urgency.  Musculoskeletal:  Negative for arthralgias, back pain and myalgias.  Skin:  Negative for pallor and rash.  Allergic/Immunologic: Negative for environmental allergies.  Neurological:  Negative for dizziness, syncope and headaches.  Hematological:  Negative for adenopathy. Does not bruise/bleed easily.  Psychiatric/Behavioral:  Negative for decreased concentration and dysphoric mood. The patient is not nervous/anxious.        Caregiver stress Doing ok overall       Objective:   Physical Exam Constitutional:      General: She is not in acute distress.    Appearance: Normal appearance. She is well-developed and normal weight. She is not ill-appearing or diaphoretic.  HENT:     Head: Normocephalic and atraumatic.     Right Ear: Tympanic membrane, ear canal and external ear normal.     Left Ear: Tympanic membrane, ear canal and external ear normal.     Nose: Nose normal. No congestion.     Mouth/Throat:     Mouth: Mucous membranes are moist.     Pharynx: Oropharynx is clear. No posterior oropharyngeal erythema.  Eyes:     General: No scleral icterus.    Extraocular Movements: Extraocular movements intact.     Conjunctiva/sclera: Conjunctivae normal.     Pupils: Pupils are equal, round, and reactive to light.  Neck:     Thyroid: No thyromegaly.     Vascular: No carotid bruit or JVD.  Cardiovascular:     Rate and Rhythm: Normal rate and regular rhythm.     Pulses: Normal pulses.     Heart sounds: Normal heart sounds.     No gallop.  Pulmonary:     Effort: Pulmonary effort is normal. No respiratory distress.     Breath sounds: Normal breath sounds. No wheezing.     Comments: Good air exch Chest:     Chest wall: No tenderness.  Abdominal:     General: Bowel sounds are normal. There is no distension or abdominal bruit.     Palpations: Abdomen is soft. There is no mass.     Tenderness: There is no abdominal tenderness.     Hernia: No hernia is  present.  Genitourinary:    Comments: Breast and pelvic exam are done by gyn provider   Musculoskeletal:        General: No tenderness. Normal range of motion.     Cervical back: Normal range of motion and neck supple. No rigidity. No muscular tenderness.     Right lower leg: No edema.     Left lower leg: No edema.     Comments: No kyphosis   Lymphadenopathy:     Cervical: No cervical adenopathy.  Skin:    General: Skin is warm and dry.     Coloration: Skin is not pale.     Findings: No erythema or rash.  Neurological:     Mental Status: She is alert. Mental status is at baseline.     Cranial Nerves: No cranial nerve deficit.     Motor: No abnormal muscle tone.     Coordination: Coordination normal.     Gait: Gait normal.     Deep Tendon Reflexes: Reflexes are normal and symmetric. Reflexes normal.  Psychiatric:        Mood and Affect: Mood normal.        Cognition and Memory: Cognition and memory normal.           Assessment & Plan:   Problem List Items Addressed This Visit       Endocrine   Goiter    No clinical or exam changes Lab Results  Component Value Date   TSH 1.66  12/26/2022           Musculoskeletal and Integument   Osteoporosis    Newly dx by gyn  Taking alendronate weekly and vit D Handout given Discussed fall prevention, supplements and exercise for bone density        Relevant Medications   alendronate (FOSAMAX) 70 MG tablet   Cholecalciferol (VITAMIN D-3 PO)     Other   Colon cancer screening    Due for screening colonoscopy  Referral done Pt will call to schedule       Relevant Orders   Ambulatory referral to Gastroenterology   Hyperlipidemia    Disc goals for lipids and reasons to control them Rev last labs with pt Rev low sat fat diet in detail Much improved taking crestor more regularly  LDL down to 112 Excellent HDL  Refilled crestor 5 mg       Relevant Medications   rosuvastatin (CRESTOR) 5 MG tablet   Routine  general medical examination at a health care facility - Primary    Reviewed health habits including diet and exercise and skin cancer prevention Reviewed appropriate screening tests for age  Also reviewed health mt list, fam hx and immunization status , as well as social and family history   See HPI Labs reviewed and ordered Flu shot today  Referred for screening colonoscopy  Mammogram is utd 02/2022  Dexa utd/ treated for osteoporosis from gyn Discussed fall prevention, supplements and exercise for bone density  PHQ 0 despite stressors of elder care        Relevant Orders   Flu vaccine trivalent PF, 6mos and older(Flulaval,Afluria,Fluarix,Fluzone) (Completed)   Other Visit Diagnoses     Need for influenza vaccination       Relevant Orders   Flu vaccine trivalent PF, 6mos and older(Flulaval,Afluria,Fluarix,Fluzone) (Completed)

## 2023-01-02 ENCOUNTER — Encounter: Payer: Self-pay | Admitting: Family Medicine

## 2023-01-02 ENCOUNTER — Ambulatory Visit (INDEPENDENT_AMBULATORY_CARE_PROVIDER_SITE_OTHER): Payer: Managed Care, Other (non HMO) | Admitting: Family Medicine

## 2023-01-02 VITALS — BP 102/58 | HR 71 | Temp 98.6°F | Ht 65.0 in | Wt 153.4 lb

## 2023-01-02 DIAGNOSIS — Z23 Encounter for immunization: Secondary | ICD-10-CM

## 2023-01-02 DIAGNOSIS — Z0000 Encounter for general adult medical examination without abnormal findings: Secondary | ICD-10-CM

## 2023-01-02 DIAGNOSIS — E7800 Pure hypercholesterolemia, unspecified: Secondary | ICD-10-CM | POA: Diagnosis not present

## 2023-01-02 DIAGNOSIS — E049 Nontoxic goiter, unspecified: Secondary | ICD-10-CM | POA: Diagnosis not present

## 2023-01-02 DIAGNOSIS — Z1211 Encounter for screening for malignant neoplasm of colon: Secondary | ICD-10-CM

## 2023-01-02 DIAGNOSIS — M810 Age-related osteoporosis without current pathological fracture: Secondary | ICD-10-CM

## 2023-01-02 MED ORDER — ROSUVASTATIN CALCIUM 5 MG PO TABS: 5.0000 mg | ORAL_TABLET | Freq: Every day | ORAL | 3 refills | Status: DC

## 2023-01-02 MED ORDER — MELOXICAM 15 MG PO TABS: 15.0000 mg | ORAL_TABLET | Freq: Every day | ORAL | 2 refills | Status: DC | PRN

## 2023-01-02 NOTE — Patient Instructions (Addendum)
Keep walking  Add some strength training to your routine, this is important for bone and brain health and can reduce your risk of falls and help your body use insulin properly and regulate weight  Light weights, exercise bands , and internet videos are a good way to start  Yoga (chair or regular), machines , floor exercises or a gym with machines are also good options    Call to schedule your screening colonoscopy  [facility]  [phone]  Take care of yourself    Cholesterol looks better Keep taking your medicine

## 2023-01-02 NOTE — Assessment & Plan Note (Addendum)
Reviewed health habits including diet and exercise and skin cancer prevention Reviewed appropriate screening tests for age  Also reviewed health mt list, fam hx and immunization status , as well as social and family history   See HPI Labs reviewed and ordered Flu shot today  Referred for screening colonoscopy  Mammogram is utd 02/2022  Dexa utd/ treated for osteoporosis from gyn Discussed fall prevention, supplements and exercise for bone density  PHQ 0 despite stressors of elder care

## 2023-01-02 NOTE — Assessment & Plan Note (Signed)
Due for screening colonoscopy  Referral done Pt will call to schedule

## 2023-01-02 NOTE — Assessment & Plan Note (Signed)
Newly dx by gyn  Taking alendronate weekly and vit D Handout given Discussed fall prevention, supplements and exercise for bone density

## 2023-01-02 NOTE — Assessment & Plan Note (Signed)
Disc goals for lipids and reasons to control them Rev last labs with pt Rev low sat fat diet in detail Much improved taking crestor more regularly  LDL down to 112 Excellent HDL  Refilled crestor 5 mg

## 2023-01-02 NOTE — Assessment & Plan Note (Signed)
No clinical or exam changes Lab Results  Component Value Date   TSH 1.66 12/26/2022

## 2023-03-02 LAB — HM MAMMOGRAPHY

## 2023-03-05 ENCOUNTER — Encounter: Payer: Self-pay | Admitting: Family Medicine

## 2023-03-16 ENCOUNTER — Encounter: Payer: Self-pay | Admitting: Family Medicine

## 2023-03-16 ENCOUNTER — Other Ambulatory Visit: Payer: Self-pay | Admitting: Family Medicine

## 2023-03-16 MED ORDER — MELOXICAM 15 MG PO TABS: 15.0000 mg | ORAL_TABLET | Freq: Every day | ORAL | 1 refills | Status: AC | PRN

## 2023-03-16 MED ORDER — ROSUVASTATIN CALCIUM 5 MG PO TABS: 5.0000 mg | ORAL_TABLET | Freq: Every day | ORAL | 1 refills | Status: DC

## 2023-03-16 NOTE — Telephone Encounter (Signed)
 CPE scheduled 01/03/24  Meloxicam last filled on 01/02/23 # 30 tabs/ 1 refill  Crestor filled on 01/02/23 #90 with 3 refill but prev message is asking if refills can be sent to mail order pharmacy

## 2023-03-16 NOTE — Telephone Encounter (Deleted)
 Copied from CRM [id number]. Topic: Clinical - Medication Refill >> Mar 16, 2023 12:02 PM Rolin D wrote: Most Recent Primary Care Visit:  Provider: RANDEEN HARDY A  Department: [facility]  Visit Type: PHYSICAL  Date: 01/02/2023  Medication: rosuvastatin  (CRESTOR ) 5 MG tablet /meloxicam  (MOBIC ) 15 MG tablet   Has the patient contacted their pharmacy? No (Agent: If no, request that the patient contact the pharmacy for the refill. If patient does not wish to contact the pharmacy document the reason why and proceed with request.) (Agent: If yes, when and what did the pharmacy advise?)  Is this the correct pharmacy for this prescription? Yes If no, delete pharmacy and type the correct one.  This is the patient's preferred pharmacy:    [facility] - [place], [place] - [address] [place] [address] Phone: [phone] Fax: [phone]   Has the prescription been filled recently? No  Is the patient out of the medication? No  Has the patient been seen for an appointment in the last year OR does the patient have an upcoming appointment? Yes  Can we respond through MyChart? Yes  Agent: Please be advised that Rx refills may take up to 3 business days. We ask that you follow-up with your pharmacy.

## 2023-03-16 NOTE — Telephone Encounter (Signed)
 Copied from CRM [id number]. Topic: Clinical - Medication Refill >> Mar 16, 2023 12:02 PM Rolin D wrote: Most Recent Primary Care Visit:  Provider: RANDEEN HARDY A  Department: [facility]  Visit Type: PHYSICAL  Date: 01/02/2023  Medication: rosuvastatin  (CRESTOR ) 5 MG tablet /meloxicam  (MOBIC ) 15 MG tablet   Has the patient contacted their pharmacy? No (Agent: If no, request that the patient contact the pharmacy for the refill. If patient does not wish to contact the pharmacy document the reason why and proceed with request.) (Agent: If yes, when and what did the pharmacy advise?)  Is this the correct pharmacy for this prescription? Yes If no, delete pharmacy and type the correct one.  This is the patient's preferred pharmacy:    [facility] - [place], [place] - [address] [place] [address] Phone: [phone] Fax: [phone]   Has the prescription been filled recently? No  Is the patient out of the medication? No  Has the patient been seen for an appointment in the last year OR does the patient have an upcoming appointment? Yes  Can we respond through MyChart? Yes  Agent: Please be advised that Rx refills may take up to 3 business days. We ask that you follow-up with your pharmacy.

## 2023-05-30 ENCOUNTER — Other Ambulatory Visit: Payer: Self-pay | Admitting: *Deleted

## 2023-05-30 NOTE — Telephone Encounter (Signed)
 Received fax from [facility] pharmacy requesting Rx for Alendronate be sent to them (3 month Rx), at CPE PCP notes that GYN was filling med. ? If PCP wants to send in Rx, please advise

## 2023-05-30 NOTE — Telephone Encounter (Signed)
 Please ask her about who prescribes this Thanks

## 2023-05-30 NOTE — Telephone Encounter (Signed)
 Pt said her gyn does refill med and she sent it to [place] in error. Will get [employer] to send the refill request to gyn

## 2023-10-19 ENCOUNTER — Other Ambulatory Visit: Payer: Self-pay | Admitting: Family Medicine

## 2023-11-06 ENCOUNTER — Ambulatory Visit: Payer: Self-pay

## 2023-11-06 NOTE — Telephone Encounter (Signed)
 FYI Only or Action Required?: Action required by provider: request for appointment.  Patient was last seen in primary care on 01/02/2023 by Randeen Laine LABOR, MD.  Called Nurse Triage reporting Mouth Lesions.  Symptoms began several months ago.  Interventions attempted: Rest, hydration, or home remedies.  Symptoms are: gradually worsening.  Triage Disposition: See PCP When Office is Open (Within 3 Days)  Patient/caregiver understands and will follow disposition?: YesCopied from CRM #[id number]. Topic: Clinical - Red Word Triage >> Nov 06, 2023  1:07 PM Meredith Farmer wrote: Red Word that prompted transfer to Nurse Triage: patient sids of her mouth is very painful, patient gets blisters and causes open sours,and worsening pain Reason for Disposition  Mouth sore (ulcer) lasts > 2 weeks  Answer Assessment - Initial Assessment Questions Sore of lips on and off for 6 months. Lips swell and corner is raw. Opening mouth to eat/talk causes the crack to break open. Can never heal     1. LOCATION: Where is the mouth sore (ulcer) located?      Corner of lip 2. NUMBER: How many sores are there? :     Not sure 3. SIZE: How large is the sore?  (e.g., size of an apple seed, watermelon seed, pencil eraser)     2 pencil eraser 4. PAIN: Are they painful? If Yes, ask: How bad is it?  (Scale 0-10; or none, mild, moderate, severe)     Mild-moderate 5. ONSET: When did you first notice the sore?      6 months ago  6. RECURRENT SYMPTOM: Have you had a mouth ulcer before? If Yes, ask: When was the last time? and What happened that time?      yes 7. CAUSE: What do you think is causing the mouth sore?     Not sure-stress 8. OTHER SYMPTOMS: Do you have any other symptoms? (e.g., fever, swollen lymph node)     denies  Protocols used: Mouth Ulcers-A-AH

## 2023-11-06 NOTE — Telephone Encounter (Signed)
 Will see patient then Agree with ER and UC precautions

## 2023-11-07 ENCOUNTER — Ambulatory Visit: Payer: Self-pay | Admitting: Family Medicine

## 2023-11-07 ENCOUNTER — Encounter: Payer: Self-pay | Admitting: Family Medicine

## 2023-11-07 ENCOUNTER — Ambulatory Visit: Payer: Self-pay

## 2023-11-07 ENCOUNTER — Other Ambulatory Visit: Payer: Self-pay | Admitting: Family Medicine

## 2023-11-07 ENCOUNTER — Ambulatory Visit (INDEPENDENT_AMBULATORY_CARE_PROVIDER_SITE_OTHER): Admitting: Family Medicine

## 2023-11-07 ENCOUNTER — Other Ambulatory Visit: Payer: Self-pay | Admitting: *Deleted

## 2023-11-07 VITALS — BP 124/72 | HR 67 | Temp 98.5°F | Ht 65.0 in | Wt 149.1 lb

## 2023-11-07 DIAGNOSIS — R233 Spontaneous ecchymoses: Secondary | ICD-10-CM | POA: Insufficient documentation

## 2023-11-07 DIAGNOSIS — L259 Unspecified contact dermatitis, unspecified cause: Secondary | ICD-10-CM | POA: Insufficient documentation

## 2023-11-07 DIAGNOSIS — K130 Diseases of lips: Secondary | ICD-10-CM | POA: Insufficient documentation

## 2023-11-07 DIAGNOSIS — B353 Tinea pedis: Secondary | ICD-10-CM | POA: Insufficient documentation

## 2023-11-07 LAB — CBC WITH DIFFERENTIAL/PLATELET
Basophils Absolute: 0 K/uL (ref 0.0–0.1)
Basophils Relative: 0.7 % (ref 0.0–3.0)
Eosinophils Absolute: 0 K/uL (ref 0.0–0.7)
Eosinophils Relative: 0.8 % (ref 0.0–5.0)
HCT: 38.8 % (ref 36.0–46.0)
Hemoglobin: 13 g/dL (ref 12.0–15.0)
Lymphocytes Relative: 38.4 % (ref 12.0–46.0)
Lymphs Abs: 1.8 K/uL (ref 0.7–4.0)
MCHC: 33.4 g/dL (ref 30.0–36.0)
MCV: 91.2 fl (ref 78.0–100.0)
Monocytes Absolute: 0.4 K/uL (ref 0.1–1.0)
Monocytes Relative: 7.9 % (ref 3.0–12.0)
Neutro Abs: 2.5 K/uL (ref 1.4–7.7)
Neutrophils Relative %: 52.2 % (ref 43.0–77.0)
Platelets: 234 K/uL (ref 150.0–400.0)
RBC: 4.25 Mil/uL (ref 3.87–5.11)
RDW: 12.8 % (ref 11.5–15.5)
WBC: 4.8 K/uL (ref 4.0–10.5)

## 2023-11-07 MED ORDER — KETOCONAZOLE 2 % EX CREA: 1.0000 | TOPICAL_CREAM | Freq: Two times a day (BID) | CUTANEOUS | 1 refills | Status: DC

## 2023-11-07 MED ORDER — TACROLIMUS 0.03 % EX OINT: TOPICAL_OINTMENT | CUTANEOUS | 1 refills | Status: AC

## 2023-11-07 MED ORDER — TACROLIMUS 0.03 % EX OINT: TOPICAL_OINTMENT | CUTANEOUS | 1 refills | Status: DC

## 2023-11-07 MED ORDER — KETOCONAZOLE 2 % EX CREA: TOPICAL_CREAM | CUTANEOUS | 1 refills | Status: AC

## 2023-11-07 MED ORDER — FLUCONAZOLE 150 MG PO TABS: 150.0000 mg | ORAL_TABLET | Freq: Once | ORAL | 0 refills | Status: AC

## 2023-11-07 NOTE — Assessment & Plan Note (Addendum)
 Peeling/ soreness in corners of mouth  No improvement with lip balm and antibiotic ointment   This could be fungal Encouraged not to lick lips Prescription ketoconazole  to use bid to affected areas  Also diflucan  150 mg oral times one   Update if not starting to improve in a week or if worsening  Call back and Er precautions noted in detail today

## 2023-11-07 NOTE — Assessment & Plan Note (Signed)
 Bilateral Worst in between 4,5th toes  With itching   Prescription ketoconazole  cream 2% to use bid  Instructed to keep feet dry  Change socks frequently  Wash shoes /do not share shoes   Update if not starting to improve in several weeks or if worsening  Call back and Er precautions noted in detail today

## 2023-11-07 NOTE — Telephone Encounter (Signed)
 Copied from CRM #[id number]. Topic: Clinical - Prescription Issue >> Nov 07, 2023  2:30 PM [place] J wrote: Reason for CRM: Patient was prescribed ketoconazole  (NIZORAL ) 2 % cream and is currently at the pharmacy. The pharmacist explained that in order to have insurance cover medication, they are needing Dr Randeen to confirm the specific quantity needed for the affected areas. >> Nov 07, 2023  2:45 PM Chiquita SQUIBB wrote: Patient is requesting the nurse to call her back when this has been done as she is waiting at the pharmacy.

## 2023-11-07 NOTE — Telephone Encounter (Signed)
  FYI Only or Action Required?: Action required by provider: medication order clarification for pharmacy.  Patient was last seen in primary care on 11/07/2023 by Randeen Laine LABOR, MD.  Called Nurse Triage reporting order clarification.  Symptoms began today.  Interventions attempted: Other: n/a.  Symptoms are: n/a.  Triage Disposition: Call PCP Now  Patient/caregiver understands and will follow disposition?: Yes  Copied from CRM #[id number]. Topic: Clinical - Prescription Issue >> Nov 07, 2023  2:30 PM [place] J wrote: Reason for CRM: Patient was prescribed ketoconazole  (NIZORAL ) 2 % cream and is currently at the pharmacy. The pharmacist explained that in order to have insurance cover medication, they are needing Dr Randeen to confirm the specific quantity needed for the affected areas. Reason for Disposition  [1] Pharmacy calling with prescription question AND [2] triager unable to answer question  Answer Assessment - Initial Assessment Questions 1. REASON FOR CALL: What is the main reason for your call? or How can I best help you?     Patient at pharmacy needing order clarification      Insurance company requires specific dosage amount for ketonazole (Nizoral  cream) and Tacrolimus  Please contact [facility] in [place], [place] to speak to anyone at pharmacy 2. SYMPTOMS : Do you have any symptoms?      N/A 3. OTHER QUESTIONS: Do you have any other questions?     N/A  Notified CAL at [facility])  Answer Assessment - Initial Assessment Questions 1. REASON FOR CALL: What is the main reason for your call? or How can I best help you?     Patient at pharmacy needing order clarification      Insurance company requires specific dosage amount for ketonazole (Nizoral  cream) and Tacrolimus  Please contact [facility] in [place], [place] to speak to anyone at pharmacy 2. SYMPTOMS : Do you have any symptoms?      N/A 3. OTHER QUESTIONS: Do you have any other  questions?     N/A  Notified CAL at [facility])  Medication prescribed by Dr. Randeen  Protocols used: Information Only Call - No Triage-A-AH, Medication Question Call-A-AH

## 2023-11-07 NOTE — Assessment & Plan Note (Signed)
 Left upper eyelid -small area  Was using triamcinolone (not my first choice due to risk of thinning skin)-which did help temporarily   Instructed to avoid scents/harsh detergents and products Prescription protopic  to try as directed   Update if not starting to improve in a week or if worsening  Call back and Er precautions noted in detail today

## 2023-11-07 NOTE — Telephone Encounter (Signed)
 A pea sized amount of ketoconazole  to corners of mouth  A pea sized amount to each foot  Thanks

## 2023-11-07 NOTE — Progress Notes (Signed)
 Subjective:    Patient ID: Meredith Farmer, female    DOB: [birth year], 58 y.o.   MRN: [id number]  HPI  Wt Readings from Last 3 Encounters:  11/07/23 149 lb 2 oz (67.6 kg)  01/02/23 153 lb 6 oz (69.6 kg)  11/16/22 155 lb 4 oz (70.4 kg)   24.82 kg/m  Vitals:   11/07/23 1225  BP: 124/72  Pulse: 67  Temp: 98.5 F (36.9 C)  SpO2: 100%   Pt presents for c/o Mouth lesion   Rash (near eye)  Itching -foot Easy bruising   Several bouts of lip swelling and chapping- on and off /would heal with lip balm Does not use sun products on lips  Bad in the corners , also painful  Does not tend to lick her lips   Recurrent scaly rash just below brow of left eye  Treating with triamcinolone cream 0.1% - had it for something in the past  It responds but then re occurs when she stops using it   Some itching between 4,5th toes on both feet  Some discoloration on toe nails   Does not wear nail polish   Easy bruising Not always with trauma     Meloxicam  is on med list  Has not taken in a while      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  Lots of stress    Lab Results  Component Value Date   NA 141 12/26/2022   K 4.7 12/26/2022   CO2 31 12/26/2022   GLUCOSE 81 12/26/2022   BUN 12 12/26/2022   CREATININE 0.74 12/26/2022   CALCIUM  9.7 12/26/2022   GFR 89.75 12/26/2022   GFRNONAA >60 10/28/2016   Lab Results  Component Value Date   WBC 4.9 12/26/2022   HGB 13.1 12/26/2022   HCT 40.7 12/26/2022   MCV 93.6 12/26/2022   PLT 251.0 12/26/2022   Lab Results  Component Value Date   ALT 11 12/26/2022   AST 16 12/26/2022   ALKPHOS 50 12/26/2022   BILITOT 0.5 12/26/2022       Patient Active Problem List   Diagnosis Date Noted   Athlete's foot 11/07/2023   Angular cheilitis 11/07/2023   Easy bruising 11/07/2023   Contact dermatitis 11/07/2023   Osteoporosis 01/02/2023   Colon cancer screening 12/29/2021   Screening mammogram, encounter for  02/20/2019   Family history of carotid artery stenosis 02/09/2016   Routine general medical examination at a health care facility 12/15/2012   Hyperlipidemia 06/07/2007   Goiter 06/06/2007   Past Medical History:  Diagnosis Date   Depression    Goiter    Hyperlipidemia    Past Surgical History:  Procedure Laterality Date   DILATION AND CURETTAGE OF UTERUS     Social History   Tobacco Use   Smoking status: Never   Smokeless tobacco: Never  Substance Use Topics   Alcohol use: Yes    Alcohol/week: 0.0 standard drinks of alcohol    Comment: one drink once a week   Drug use: No   Family History  Problem Relation Age of Onset   Depression Mother    Hyperlipidemia Mother    Alcohol abuse Father    Alcohol abuse Brother    Cancer Paternal Aunt        colon   Cancer Paternal Uncle        colon   Heart attack Maternal Grandfather    Diabetes Maternal Grandmother    Allergies  Allergen Reactions   Atorvastatin      Muscle pain   Codeine      Headache and upset stomach   Hydrocodone  Bit-Homatrop Mbr     Nausea    Current Outpatient Medications on File Prior to Visit  Medication Sig Dispense Refill   alendronate (FOSAMAX) 70 MG tablet Take 70 mg by mouth once a week.     Cholecalciferol (VITAMIN D-3 PO) Take 1 capsule by mouth daily.     meloxicam  (MOBIC ) 15 MG tablet Take 1 tablet (15 mg total) by mouth daily as needed for pain (headache). With food as needed for pain or headache 90 tablet 1   Multiple Vitamins-Minerals (MULTIVITAL PO) Take 1 capsule by mouth once a week.     Omega-3 Fatty Acids (FISH OIL PO) Take 1 capsule by mouth once a week.     rosuvastatin  (CRESTOR ) 5 MG tablet TAKE 1 TABLET DAILY 90 tablet 1   No current facility-administered medications on file prior to visit.    Review of Systems  Constitutional:  Positive for fatigue. Negative for activity change, appetite change, fever and unexpected weight change.  HENT:  Negative for congestion, ear pain,  rhinorrhea, sinus pressure and sore throat.   Eyes:  Negative for pain, redness and visual disturbance.  Respiratory:  Negative for cough, shortness of breath and wheezing.   Cardiovascular:  Negative for chest pain and palpitations.  Gastrointestinal:  Negative for abdominal pain, blood in stool, constipation and diarrhea.  Endocrine: Negative for polydipsia and polyuria.  Genitourinary:  Negative for dysuria, frequency and urgency.  Musculoskeletal:  Negative for arthralgias, back pain and myalgias.  Skin:  Positive for rash. Negative for pallor and wound.       Itching feet   Allergic/Immunologic: Negative for environmental allergies.  Neurological:  Negative for dizziness, syncope and headaches.  Hematological:  Negative for adenopathy. Does not bruise/bleed easily.  Psychiatric/Behavioral:  Negative for decreased concentration and dysphoric mood. The patient is not nervous/anxious.        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    Appearance: Normal appearance. She is well-developed and normal weight. She is not ill-appearing or diaphoretic.  HENT:     Head: Normocephalic and atraumatic.  Eyes:     Conjunctiva/sclera: Conjunctivae normal.     Pupils: Pupils are equal, round, and reactive to light.  Neck:     Thyroid : No thyromegaly.     Vascular: No carotid bruit or JVD.  Cardiovascular:     Rate and Rhythm: Normal rate and regular rhythm.     Heart sounds: Normal heart sounds.     No gallop.  Pulmonary:     Effort: Pulmonary effort is normal. No respiratory distress.     Breath sounds: Normal breath sounds. No wheezing or rales.  Abdominal:     General: There is no distension or abdominal bruit.     Palpations: Abdomen is soft.  Musculoskeletal:     Cervical back: Normal range of motion and neck supple.     Right lower leg: No edema.     Left lower leg: No edema.   Lymphadenopathy:     Cervical: No cervical adenopathy.  Skin:    General: Skin is warm and dry.     Coloration: Skin is not pale.     Findings: No rash.  Comments: 1 cm area of pink scale below medial left brow   Cracking/peeling of skin in corners of mouth   Maceration of skin between 4,5 th toes  Some peeling on soles  No erythema   Small (1-2 cm) bruises of different ages- each upper arm and right thigh  Neurological:     Mental Status: She is alert.     Coordination: Coordination normal.     Deep Tendon Reflexes: Reflexes are normal and symmetric. Reflexes normal.  Psychiatric:        Mood and Affect: Mood normal.           Assessment & Plan:   Problem List Items Addressed This Visit       Digestive   Angular cheilitis - Primary   Peeling/ soreness in corners of mouth  No improvement with lip balm and antibiotic ointment   This could be fungal Encouraged not to lick lips Prescription ketoconazole  to use bid to affected areas  Also diflucan  150 mg oral times one   Update if not starting to improve in a week or if worsening  Call back and Er precautions noted in detail today          Musculoskeletal and Integument   Contact dermatitis   Left upper eyelid -small area  Was using triamcinolone (not my first choice due to risk of thinning skin)-which did help temporarily   Instructed to avoid scents/harsh detergents and products Prescription protopic  to try as directed   Update if not starting to improve in a week or if worsening  Call back and Er precautions noted in detail today        Athlete's foot   Bilateral Worst in between 4,5th toes  With itching   Prescription ketoconazole  cream 2% to use bid  Instructed to keep feet dry  Change socks frequently  Wash shoes /do not share shoes   Update if not starting to improve in several weeks or if worsening  Call back and Er precautions noted in detail today         Relevant Medications    ketoconazole  (NIZORAL ) 2 % cream   fluconazole  (DIFLUCAN ) 150 MG tablet   tacrolimus  (PROTOPIC ) 0.03 % ointment     Other   Easy bruising   Several old bruises on upper arms Also right leg   Mild  Suspect due to age/skin changes  Cbc today   Call back and Er precautions noted in detail today        Relevant Orders   CBC with Differential/Platelet

## 2023-11-07 NOTE — Telephone Encounter (Signed)
Addressed through separate phone note  

## 2023-11-07 NOTE — Telephone Encounter (Signed)
 Spoke with pharmacy and they need new Rxs sent. Also they said there was a problem with both tacrolimus  and the ketoconazole . It's the same issue but they can't take a pea size both Rx have to have a certain amount of grams on the does so it needs to say something like apply 0.2 g topically   They asked to resend both Rxs electronically with updated directions

## 2023-11-07 NOTE — Patient Instructions (Addendum)
 Use the ketoconazole  cream on corners of your mouth and also feet twice daily   Take the oral diflucan  once 150 mg    Try not to lick your lips   Keep shoes/socks very clean and dry  Stop sharing shoes   Lab for cbc today   Try the protopic  ointment on the dermatitis on your eyelid   Avoid hot water  Avoid harsh detergents  Avoid scents and fragrances   I recommend dove soap for sensitive skin  Laundry detergent- use a free  No fabric softeners

## 2023-11-07 NOTE — Assessment & Plan Note (Signed)
 Several old bruises on upper arms Also right leg   Mild  Suspect due to age/skin changes  Cbc today   Call back and Er precautions noted in detail today

## 2023-12-25 ENCOUNTER — Telehealth: Payer: Self-pay | Admitting: Family Medicine

## 2023-12-25 DIAGNOSIS — M810 Age-related osteoporosis without current pathological fracture: Secondary | ICD-10-CM

## 2023-12-25 DIAGNOSIS — Z0000 Encounter for general adult medical examination without abnormal findings: Secondary | ICD-10-CM

## 2023-12-25 DIAGNOSIS — E7800 Pure hypercholesterolemia, unspecified: Secondary | ICD-10-CM

## 2023-12-25 NOTE — Telephone Encounter (Signed)
-----   Message from Veva JINNY Ferrari sent at 12/11/2023 11:35 AM EDT ----- Regarding: Lab orders for [facility], 10.16.25 Patient is scheduled for CPX labs, please order future labs, Thanks , Veva

## 2023-12-27 ENCOUNTER — Other Ambulatory Visit (INDEPENDENT_AMBULATORY_CARE_PROVIDER_SITE_OTHER): Payer: Managed Care, Other (non HMO)

## 2023-12-27 ENCOUNTER — Ambulatory Visit: Payer: Self-pay | Admitting: Family Medicine

## 2023-12-27 DIAGNOSIS — E7800 Pure hypercholesterolemia, unspecified: Secondary | ICD-10-CM | POA: Diagnosis not present

## 2023-12-27 DIAGNOSIS — M810 Age-related osteoporosis without current pathological fracture: Secondary | ICD-10-CM | POA: Diagnosis not present

## 2023-12-27 DIAGNOSIS — Z0000 Encounter for general adult medical examination without abnormal findings: Secondary | ICD-10-CM | POA: Diagnosis not present

## 2023-12-27 LAB — CBC WITH DIFFERENTIAL/PLATELET
Basophils Absolute: 0 K/uL (ref 0.0–0.1)
Basophils Relative: 0.5 % (ref 0.0–3.0)
Eosinophils Absolute: 0.1 K/uL (ref 0.0–0.7)
Eosinophils Relative: 1.2 % (ref 0.0–5.0)
HCT: 39.3 % (ref 36.0–46.0)
Hemoglobin: 13.1 g/dL (ref 12.0–15.0)
Lymphocytes Relative: 37.8 % (ref 12.0–46.0)
Lymphs Abs: 1.8 K/uL (ref 0.7–4.0)
MCHC: 33.4 g/dL (ref 30.0–36.0)
MCV: 92.5 fl (ref 78.0–100.0)
Monocytes Absolute: 0.4 K/uL (ref 0.1–1.0)
Monocytes Relative: 8.1 % (ref 3.0–12.0)
Neutro Abs: 2.4 K/uL (ref 1.4–7.7)
Neutrophils Relative %: 52.4 % (ref 43.0–77.0)
Platelets: 227 K/uL (ref 150.0–400.0)
RBC: 4.25 Mil/uL (ref 3.87–5.11)
RDW: 12.8 % (ref 11.5–15.5)
WBC: 4.7 K/uL (ref 4.0–10.5)

## 2023-12-27 LAB — LIPID PANEL
Cholesterol: 259 mg/dL — ABNORMAL HIGH (ref 0–200)
HDL: 95.2 mg/dL (ref >39.00)
LDL Cholesterol: 150 mg/dL — ABNORMAL HIGH (ref 0–99)
NonHDL: 164
Total CHOL/HDL Ratio: 3
Triglycerides: 69 mg/dL (ref 0.0–149.0)
VLDL: 13.8 mg/dL (ref 0.0–40.0)

## 2023-12-27 LAB — COMPREHENSIVE METABOLIC PANEL WITH GFR
ALT: 8 U/L (ref 0–35)
AST: 14 U/L (ref 0–37)
Albumin: 4.3 g/dL (ref 3.5–5.2)
Alkaline Phosphatase: 42 U/L (ref 39–117)
BUN: 14 mg/dL (ref 6–23)
CO2: 26 meq/L (ref 19–32)
Calcium: 8.9 mg/dL (ref 8.4–10.5)
Chloride: 105 meq/L (ref 96–112)
Creatinine, Ser: 0.76 mg/dL (ref 0.40–1.20)
GFR: 86.31 mL/min (ref >60.00)
Glucose, Bld: 94 mg/dL (ref 70–99)
Potassium: 4.3 meq/L (ref 3.5–5.1)
Sodium: 142 meq/L (ref 135–145)
Total Bilirubin: 0.4 mg/dL (ref 0.2–1.2)
Total Protein: 6.2 g/dL (ref 6.0–8.3)

## 2023-12-27 LAB — TSH: TSH: 1.1 u[IU]/mL (ref 0.35–5.50)

## 2023-12-27 LAB — VITAMIN D 25 HYDROXY (VIT D DEFICIENCY, FRACTURES): VITD: 24.18 ng/mL — ABNORMAL LOW (ref 30.00–100.00)

## 2024-01-03 ENCOUNTER — Encounter: Payer: Managed Care, Other (non HMO) | Admitting: Family Medicine

## 2024-01-11 ENCOUNTER — Ambulatory Visit (INDEPENDENT_AMBULATORY_CARE_PROVIDER_SITE_OTHER): Admitting: Family Medicine

## 2024-01-11 ENCOUNTER — Encounter: Payer: Self-pay | Admitting: Family Medicine

## 2024-01-11 VITALS — BP 94/64 | HR 65 | Temp 98.2°F | Ht 64.75 in | Wt 146.0 lb

## 2024-01-11 DIAGNOSIS — M810 Age-related osteoporosis without current pathological fracture: Secondary | ICD-10-CM | POA: Diagnosis not present

## 2024-01-11 DIAGNOSIS — Z85828 Personal history of other malignant neoplasm of skin: Secondary | ICD-10-CM

## 2024-01-11 DIAGNOSIS — E7800 Pure hypercholesterolemia, unspecified: Secondary | ICD-10-CM

## 2024-01-11 DIAGNOSIS — Z1211 Encounter for screening for malignant neoplasm of colon: Secondary | ICD-10-CM

## 2024-01-11 DIAGNOSIS — Z0000 Encounter for general adult medical examination without abnormal findings: Secondary | ICD-10-CM

## 2024-01-11 NOTE — Assessment & Plan Note (Signed)
 No falls or fractures  Sent for dexa report from gyn  On alendronate weekly  Encouraged to get back on track with vitamin D  Level was low   Discussed fall prevention, supplements and exercise for bone density  Encouraged more strength building exercise

## 2024-01-11 NOTE — Assessment & Plan Note (Signed)
 Disc goals for lipids and reasons to control them Rev last labs with pt Rev low sat fat diet in detail LDL up to 150  Not taking crestor  5 mg  Encouraged to start back and try co enzyme q 10 for muscle pain (under arms)   If not helpful consider zetia

## 2024-01-11 NOTE — Progress Notes (Signed)
 Subjective:    Patient ID: Meredith Farmer, female    DOB: [birth year], 58 y.o.   MRN: [id number]  HPI  Here for health maintenance exam and to review chronic medical problems   Wt Readings from Last 3 Encounters:  01/11/24 146 lb (66.2 kg)  11/07/23 149 lb 2 oz (67.6 kg)  01/02/23 153 lb 6 oz (69.6 kg)   24.48 kg/m  Vitals:   01/11/24 0825  BP: 94/64  Pulse: 65  Temp: 98.2 F (36.8 C)  SpO2: 100%    Immunization History  Administered Date(s) Administered   Hepatitis A 03/13/1994   Hepatitis B 03/13/1994   Influenza Split 12/12/2010, 12/28/2011, 11/18/2014   Influenza Whole 12/12/2006, 12/13/2009, 12/05/2017   Influenza, Seasonal, Injecte, Preservative Fre 01/02/2023   Influenza,inj,Quad PF,6+ Mos 01/28/2016, 02/20/2019, 12/20/2019, 12/21/2021   Influenza-Unspecified 12/16/2012, 12/06/2013, 11/30/2016   Td 05/05/2005   Tdap 02/09/2016   Zoster Recombinant(Shingrix) 02/23/2018, 09/09/2018    Health Maintenance Due  Topic Date Due   HIV Screening  Never done   Hepatitis C Screening  Never done   Pneumococcal Vaccine: 50+ Years (1 of 2 - PCV) Never done   Hepatitis B Vaccines 19-59 Average Risk (2 of 3 - 19+ 3-dose series) 04/10/1994   Cervical Cancer Screening (HPV/Pap Cotest)  04/29/2023   Mammogram  03/01/2024   Doing [facility]  Mammogram 02/2023 -goes to [facility]  Self breast exam  Gyn health [facility] 20[facility]    Colon cancer screening  Colonoscopy 05/2012  No history of polyps   Bone health  Dexa -at gyn  Osteoporosis Alendronate weekly  Falls- none  Fractures-none  Supplements vitamin D / bad about taking it  Last vitamin D Lab Results  Component Value Date   VD25OH 24.18 (L) 12/27/2023     Exercise  Biking  Walking  More on the weekends  Not using her weights     Had basal cell removed from face  Still healing     Mood    01/11/2024    8:28 AM 11/07/2023   12:36 PM 01/02/2023    8:18 AM 12/29/2021    10:07 AM 12/22/2020   11:20 AM  Depression screen PHQ 2/9  Decreased Interest 0 0 0 0 0  Down, Depressed, Hopeless 0 0 0 0 0  PHQ - 2 Score 0 0 0 0 0  Altered sleeping 0 0 0    Tired, decreased energy 0 0 0    Change in appetite 0 0 0    Feeling bad or failure about yourself  0 0 0    Trouble concentrating 0 0 0    Moving slowly or fidgety/restless 0 0 0    Suicidal thoughts 0 0 0    PHQ-9 Score 0 0 0    Difficult doing work/chores Not difficult at all Not difficult at all Not difficult at all      Hyperlipidemia Lab Results  Component Value Date   CHOL 259 (H) 12/27/2023   CHOL 229 (H) 12/26/2022   CHOL 265 (H) 12/20/2021   Lab Results  Component Value Date   HDL 95.20 12/27/2023   HDL 104.90 12/26/2022   HDL 104.80 12/20/2021   Lab Results  Component Value Date   LDLCALC 150 (H) 12/27/2023   LDLCALC 112 (H) 12/26/2022   LDLCALC 147 (H) 12/20/2021   Lab Results  Component Value Date   TRIG 69.0 12/27/2023   TRIG 59.0 12/26/2022   TRIG 68.0 12/20/2021  Lab Results  Component Value Date   CHOLHDL 3 12/27/2023   CHOLHDL 2 12/26/2022   CHOLHDL 3 12/20/2021   Lab Results  Component Value Date   LDLDIRECT 135.9 12/16/2012   LDLDIRECT 143.4 01/23/2012   LDLDIRECT 148.5 02/24/2011   Crestor  5 mg daily  Not taking it lately  Does cause some muscle pain - annoying but not severe  The 10-year ASCVD risk score (Arnett DK, et al., 2019) is: 1.2%   Values used to calculate the score:     Age: 55 years     Clincally relevant sex: Female     Is Non-Hispanic African American: No     Diabetic: No     Tobacco smoker: No     Systolic Blood Pressure: 94 mmHg     Is BP treated: No     HDL Cholesterol: 95.2 mg/dL     Total Cholesterol: 259 mg/dL    Lab Results  Component Value Date   NA 142 12/27/2023   K 4.3 12/27/2023   CO2 26 12/27/2023   GLUCOSE 94 12/27/2023   BUN 14 12/27/2023   CREATININE 0.76 12/27/2023   CALCIUM  8.9 12/27/2023   GFR 86.31  12/27/2023   GFRNONAA >60 10/28/2016   Lab Results  Component Value Date   ALT 8 12/27/2023   AST 14 12/27/2023   ALKPHOS 42 12/27/2023   BILITOT 0.4 12/27/2023   Lab Results  Component Value Date   WBC 4.7 12/27/2023   HGB 13.1 12/27/2023   HCT 39.3 12/27/2023   MCV 92.5 12/27/2023   PLT 227.0 12/27/2023   Lab Results  Component Value Date   TSH 1.10 12/27/2023      Patient Active Problem List   Diagnosis Date Noted   History of basal cell cancer 01/11/2024   Athlete's foot 11/07/2023   Angular cheilitis 11/07/2023   Osteoporosis 01/02/2023   Colon cancer screening 12/29/2021   Screening mammogram, encounter for 02/20/2019   Family history of carotid artery stenosis 02/09/2016   Routine general medical examination at a health care facility 12/15/2012   Hyperlipidemia 06/07/2007   Goiter 06/06/2007   Past Medical History:  Diagnosis Date   Depression    Goiter    Hyperlipidemia    Past Surgical History:  Procedure Laterality Date   DILATION AND CURETTAGE OF UTERUS     Social History   Tobacco Use   Smoking status: Never   Smokeless tobacco: Never  Substance Use Topics   Alcohol use: Yes    Alcohol/week: 0.0 standard drinks of alcohol    Comment: one drink once a week   Drug use: No   Family History  Problem Relation Age of Onset   Depression Mother    Hyperlipidemia Mother    Alcohol abuse Father    Alcohol abuse Brother    Cancer Paternal Aunt        colon   Cancer Paternal Uncle        colon   Heart attack Maternal Grandfather    Diabetes Maternal Grandmother    Allergies  Allergen Reactions   Atorvastatin      Muscle pain   Codeine      Headache and upset stomach   Hydrocodone  Bit-Homatrop Mbr     Nausea    Current Outpatient Medications on File Prior to Visit  Medication Sig Dispense Refill   alendronate (FOSAMAX) 70 MG tablet Take 70 mg by mouth once a week.     Cholecalciferol (VITAMIN D-3 PO) Take 1  capsule by mouth daily.      hydrocortisone 2.5 % cream Apply 1 Application topically daily as needed.     ketoconazole  (NIZORAL ) 2 % cream Apply 0.2 g of cream to corners of mouth and 0.2 g to each foot bid 30 g 1   meloxicam  (MOBIC ) 15 MG tablet Take 1 tablet (15 mg total) by mouth daily as needed for pain (headache). With food as needed for pain or headache 90 tablet 1   Multiple Vitamins-Minerals (MULTIVITAL PO) Take 1 capsule by mouth once a week.     Omega-3 Fatty Acids (FISH OIL PO) Take 1 capsule by mouth once a week.     rosuvastatin  (CRESTOR ) 5 MG tablet TAKE 1 TABLET DAILY 90 tablet 1   tacrolimus  (PROTOPIC ) 0.03 % ointment Apply 0.2 g of cream to affected area of dermatitis 30 g 1   No current facility-administered medications on file prior to visit.    Review of Systems  Constitutional:  Negative for activity change, appetite change, fatigue, fever and unexpected weight change.  HENT:  Negative for congestion, ear pain, rhinorrhea, sinus pressure and sore throat.   Eyes:  Negative for pain, redness and visual disturbance.  Respiratory:  Negative for cough, shortness of breath and wheezing.   Cardiovascular:  Negative for chest pain and palpitations.  Gastrointestinal:  Negative for abdominal pain, blood in stool, constipation and diarrhea.  Endocrine: Negative for polydipsia and polyuria.  Genitourinary:  Negative for dysuria, frequency and urgency.  Musculoskeletal:  Negative for arthralgias, back pain and myalgias.  Skin:  Negative for pallor and rash.       Healing from skin cancer removal  Allergic/Immunologic: Negative for environmental allergies.  Neurological:  Negative for dizziness, syncope and headaches.  Hematological:  Negative for adenopathy. Does not bruise/bleed easily.  Psychiatric/Behavioral:  Negative for decreased concentration and dysphoric mood. The patient is not nervous/anxious.        Objective:   Physical Exam Constitutional:      General: She is not in acute distress.     Appearance: Normal appearance. She is well-developed and normal weight. She is not ill-appearing or diaphoretic.  HENT:     Head: Normocephalic and atraumatic.     Right Ear: Tympanic membrane, ear canal and external ear normal.     Left Ear: Tympanic membrane, ear canal and external ear normal.     Nose: Nose normal. No congestion.     Mouth/Throat:     Mouth: Mucous membranes are moist.     Pharynx: Oropharynx is clear. No posterior oropharyngeal erythema.  Eyes:     General: No scleral icterus.    Extraocular Movements: Extraocular movements intact.     Conjunctiva/sclera: Conjunctivae normal.     Pupils: Pupils are equal, round, and reactive to light.  Neck:     Thyroid : No thyromegaly.     Vascular: No carotid bruit or JVD.  Cardiovascular:     Rate and Rhythm: Normal rate and regular rhythm.     Pulses: Normal pulses.     Heart sounds: Normal heart sounds.     No gallop.  Pulmonary:     Effort: Pulmonary effort is normal. No respiratory distress.     Breath sounds: Normal breath sounds. No wheezing.     Comments: Good air exch Chest:     Chest wall: No tenderness.  Abdominal:     General: Bowel sounds are normal. There is no distension or abdominal bruit.     Palpations: Abdomen  is soft. There is no mass.     Tenderness: There is no abdominal tenderness.     Hernia: No hernia is present.  Musculoskeletal:        General: No tenderness. Normal range of motion.     Cervical back: Normal range of motion and neck supple. No rigidity. No muscular tenderness.     Right lower leg: No edema.     Left lower leg: No edema.     Comments: No kyphosis   Lymphadenopathy:     Cervical: No cervical adenopathy.  Skin:    General: Skin is warm and dry.     Coloration: Skin is not pale.     Findings: No erythema or rash.     Comments: Bandage on left face from [facility] removal  Solar lentigines diffusely   Neurological:     Mental Status: She is alert. Mental status is at baseline.      Cranial Nerves: No cranial nerve deficit.     Motor: No abnormal muscle tone.     Coordination: Coordination normal.     Gait: Gait normal.     Deep Tendon Reflexes: Reflexes are normal and symmetric.  Psychiatric:        Mood and Affect: Mood normal.        Cognition and Memory: Cognition and memory normal.           Assessment & Plan:   Problem List Items Addressed This Visit       Musculoskeletal and Integument   Osteoporosis   No falls or fractures  Sent for dexa report from gyn  On alendronate weekly  Encouraged to get back on track with vitamin D  Level was low   Discussed fall prevention, supplements and exercise for bone density  Encouraged more strength building exercise       History of basal cell cancer     Other   Routine general medical examination at a health care facility - Primary   Reviewed health habits including diet and exercise and skin cancer prevention Reviewed appropriate screening tests for age  Also reviewed health mt list, fam hx and immunization status , as well as social and family history   See HPI Labs reviewed and ordered Health Maintenance  Topic Date Due   HIV Screening  Never done   Hepatitis C Screening  Never done   Pneumococcal Vaccine for age over 25 (1 of 2 - PCV) Never done   Hepatitis B Vaccine (2 of 3 - 19+ 3-dose series) 04/10/1994   Pap with HPV screening  04/29/2023   Breast Cancer Screening  03/01/2024   Colon Cancer Screening  01/10/2025*   COVID-19 Vaccine (1 - 2025-26 season) 01/26/2026*   DTaP/Tdap/Td vaccine (3 - Td or Tdap) 02/08/2026   Flu Shot  Completed   Zoster (Shingles) Vaccine  Completed   HPV Vaccine  Aged Out   Meningitis B Vaccine  Aged Out  *Topic was postponed. The date shown is not the original due date.    [facility] Cologuard ordered  Sent for dexa report  No falls/fractures Discussed fall prevention, supplements and exercise for bone density  PHQ  0        Hyperlipidemia   Disc goals for lipids and reasons to control them Rev last labs with pt Rev low sat fat diet in detail LDL up to 150  Not taking crestor  5 mg  Encouraged to start back and  try co enzyme q 10 for muscle pain (under arms)   If not helpful consider zetia       Colon cancer screening   Ordered cologuard Colonoscopy 2014  No polyps in past       Relevant Orders   Cologuard

## 2024-01-11 NOTE — Patient Instructions (Addendum)
 You are due for a mammogram in December- call [facility] to schedule   I put the referral in for cologuard  Please let us  know if you don't hear in 1-2 weeks to set that up (mychart message or call or letter)   Make sure and take at least 4000 international units of vitamin D3 daily   Stay active  Add some strength training to your routine, this is important for bone and brain health and can reduce your risk of falls and help your body use insulin properly and regulate weight  Light weights, exercise bands , and internet videos are a good way to start  Yoga (chair or regular), machines , floor exercises or a gym with machines are also good options   If you want to take crestor  again  Try it with over the counter co enzyme Q10 -may help side  If side effects are bad we can consider zetia    Avoid red meat/ fried foods/ egg yolks/ fatty breakfast meats/ butter, cheese and high fat dairy/ and shellfish

## 2024-01-11 NOTE — Assessment & Plan Note (Signed)
 Ordered cologuard Colonoscopy 2014  No polyps in past

## 2024-01-11 NOTE — Assessment & Plan Note (Signed)
 Reviewed health habits including diet and exercise and skin cancer prevention Reviewed appropriate screening tests for age  Also reviewed health mt list, fam hx and immunization status , as well as social and family history   See HPI Labs reviewed and ordered Health Maintenance  Topic Date Due   HIV Screening  Never done   Hepatitis C Screening  Never done   Pneumococcal Vaccine for age over 15 (1 of 2 - PCV) Never done   Hepatitis B Vaccine (2 of 3 - 19+ 3-dose series) 04/10/1994   Pap with HPV screening  04/29/2023   Breast Cancer Screening  03/01/2024   Colon Cancer Screening  01/10/2025*   COVID-19 Vaccine (1 - 2025-26 season) 01/26/2026*   DTaP/Tdap/Td vaccine (3 - Td or Tdap) 02/08/2026   Flu Shot  Completed   Zoster (Shingles) Vaccine  Completed   HPV Vaccine  Aged Out   Meningitis B Vaccine  Aged Out  *Topic was postponed. The date shown is not the original due date.    [facility] get mamm[facility] dec Cologuard ordered  Sent for dexa report  No falls/fractures Discussed fall prevention, supplements and exercise for bone density  PHQ 0

## 2024-01-12 ENCOUNTER — Encounter: Payer: Self-pay | Admitting: Family Medicine

## 2024-02-04 ENCOUNTER — Ambulatory Visit: Payer: Self-pay | Admitting: Family Medicine

## 2024-02-04 LAB — COLOGUARD

## 2024-02-21 LAB — COLOGUARD: COLOGUARD: NEGATIVE

## 2024-03-04 LAB — HM MAMMOGRAPHY

## 2024-03-07 ENCOUNTER — Encounter: Payer: Self-pay | Admitting: Family Medicine
